# Patient Record
Sex: Female | Born: 1985 | Race: Black or African American | Hispanic: No | Marital: Single | Smoking: Current every day smoker
Health system: Southern US, Community
[De-identification: ages and names within clinical notes are randomized; demographics above are authoritative.]

## PROBLEM LIST (undated history)

## (undated) DIAGNOSIS — Z789 Other specified health status: Secondary | ICD-10-CM

## (undated) HISTORY — PX: NO PAST SURGERIES: SHX2092

---

## 2003-12-27 ENCOUNTER — Emergency Department: Payer: Self-pay | Admitting: Unknown Physician Specialty

## 2004-03-06 ENCOUNTER — Emergency Department: Payer: Self-pay | Admitting: Emergency Medicine

## 2004-04-03 ENCOUNTER — Emergency Department: Payer: Self-pay | Admitting: General Practice

## 2004-12-29 ENCOUNTER — Emergency Department: Payer: Self-pay | Admitting: Emergency Medicine

## 2005-07-01 ENCOUNTER — Emergency Department: Payer: Self-pay | Admitting: Emergency Medicine

## 2005-08-17 ENCOUNTER — Emergency Department: Payer: Self-pay | Admitting: Emergency Medicine

## 2006-06-13 ENCOUNTER — Emergency Department: Payer: Self-pay | Admitting: Emergency Medicine

## 2006-09-21 ENCOUNTER — Emergency Department: Payer: Self-pay

## 2009-08-02 ENCOUNTER — Emergency Department: Payer: Self-pay | Admitting: Emergency Medicine

## 2012-03-05 ENCOUNTER — Emergency Department: Payer: Self-pay | Admitting: Emergency Medicine

## 2013-05-19 ENCOUNTER — Emergency Department: Payer: Self-pay | Admitting: Internal Medicine

## 2013-08-13 ENCOUNTER — Emergency Department: Payer: Self-pay | Admitting: Emergency Medicine

## 2014-03-05 LAB — HM HIV SCREENING LAB: HM HIV Screening: NEGATIVE

## 2014-03-24 LAB — HM PAP SMEAR: HM Pap smear: NEGATIVE

## 2015-01-27 ENCOUNTER — Emergency Department: Payer: Self-pay

## 2015-01-27 ENCOUNTER — Emergency Department
Admission: EM | Admit: 2015-01-27 | Discharge: 2015-01-27 | Disposition: A | Discharge status: Home / Self Care | Payer: Self-pay | Attending: Emergency Medicine | Admitting: Emergency Medicine

## 2015-01-27 ENCOUNTER — Encounter: Payer: Self-pay | Admitting: *Deleted

## 2015-01-27 DIAGNOSIS — N939 Abnormal uterine and vaginal bleeding, unspecified: Secondary | ICD-10-CM

## 2015-01-27 DIAGNOSIS — F17200 Nicotine dependence, unspecified, uncomplicated: Secondary | ICD-10-CM | POA: Insufficient documentation

## 2015-01-27 DIAGNOSIS — D259 Leiomyoma of uterus, unspecified: Secondary | ICD-10-CM | POA: Insufficient documentation

## 2015-01-27 DIAGNOSIS — N7011 Chronic salpingitis: Secondary | ICD-10-CM | POA: Insufficient documentation

## 2015-01-27 DIAGNOSIS — R52 Pain, unspecified: Secondary | ICD-10-CM

## 2015-01-27 DIAGNOSIS — Z3202 Encounter for pregnancy test, result negative: Secondary | ICD-10-CM | POA: Insufficient documentation

## 2015-01-27 DIAGNOSIS — N938 Other specified abnormal uterine and vaginal bleeding: Secondary | ICD-10-CM | POA: Insufficient documentation

## 2015-01-27 LAB — CHLAMYDIA/NGC RT PCR (ARMC ONLY)
Chlamydia Tr: NOT DETECTED
N gonorrhoeae: NOT DETECTED

## 2015-01-27 LAB — BASIC METABOLIC PANEL
Anion gap: 6 (ref 5–15)
BUN: 10 mg/dL (ref 6–20)
CHLORIDE: 108 mmol/L (ref 101–111)
CO2: 26 mmol/L (ref 22–32)
CREATININE: 0.77 mg/dL (ref 0.44–1.00)
Calcium: 9 mg/dL (ref 8.9–10.3)
GFR calc Af Amer: >60 mL/min (ref >60)
Glucose, Bld: 111 mg/dL — ABNORMAL HIGH (ref 65–99)
POTASSIUM: 3.6 mmol/L (ref 3.5–5.1)
SODIUM: 140 mmol/L (ref 135–145)

## 2015-01-27 LAB — URINALYSIS COMPLETE WITH MICROSCOPIC (ARMC ONLY)
BILIRUBIN URINE: NEGATIVE
Bacteria, UA: NONE SEEN
Glucose, UA: NEGATIVE mg/dL
KETONES UR: NEGATIVE mg/dL
Leukocytes, UA: NEGATIVE
Nitrite: NEGATIVE
Protein, ur: 30 mg/dL — AB
SPECIFIC GRAVITY, URINE: 1.026 (ref 1.005–1.030)
pH: 5 (ref 5.0–8.0)

## 2015-01-27 LAB — CBC WITH DIFFERENTIAL/PLATELET
BASOS ABS: 0.1 10*3/uL (ref 0–0.1)
Basophils Relative: 1 %
EOS PCT: 3 %
Eosinophils Absolute: 0.4 10*3/uL (ref 0–0.7)
HCT: 37.4 % (ref 35.0–47.0)
HEMOGLOBIN: 12.6 g/dL (ref 12.0–16.0)
Lymphocytes Relative: 43 %
Lymphs Abs: 5.5 10*3/uL — ABNORMAL HIGH (ref 1.0–3.6)
MCH: 30.7 pg (ref 26.0–34.0)
MCHC: 33.5 g/dL (ref 32.0–36.0)
MCV: 91.6 fL (ref 80.0–100.0)
Monocytes Absolute: 0.9 10*3/uL (ref 0.2–0.9)
Monocytes Relative: 7 %
Neutro Abs: 5.9 10*3/uL (ref 1.4–6.5)
Neutrophils Relative %: 46 %
Platelets: 326 10*3/uL (ref 150–440)
RBC: 4.09 MIL/uL (ref 3.80–5.20)
RDW: 13.7 % (ref 11.5–14.5)
WBC: 12.8 10*3/uL — ABNORMAL HIGH (ref 3.6–11.0)

## 2015-01-27 LAB — WET PREP, GENITAL
Clue Cells Wet Prep HPF POC: POSITIVE — AB
SPERM: NONE SEEN
TRICH WET PREP: NONE SEEN
WBC WET PREP: NONE SEEN
YEAST WET PREP: NONE SEEN

## 2015-01-27 LAB — PREGNANCY, URINE: PREG TEST UR: NEGATIVE

## 2015-01-27 MED ORDER — SODIUM CHLORIDE 0.9 % IV BOLUS (SEPSIS)
500.0000 mL | Freq: Once | INTRAVENOUS | Status: AC
  Administered 2015-01-27: 500 mL via INTRAVENOUS

## 2015-01-27 MED ORDER — MEDROXYPROGESTERONE ACETATE 10 MG PO TABS: 10.0000 mg | ORAL_TABLET | Freq: Every day | ORAL | Status: DC

## 2015-01-27 MED ORDER — ONDANSETRON HCL 4 MG/2ML IJ SOLN
4.0000 mg | Freq: Once | INTRAMUSCULAR | Status: AC
  Administered 2015-01-27: 4 mg via INTRAVENOUS
  Filled 2015-01-27: qty 2

## 2015-01-27 MED ORDER — GADOBENATE DIMEGLUMINE 529 MG/ML IV SOLN
20.0000 mL | Freq: Once | INTRAVENOUS | Status: AC | PRN
  Administered 2015-01-27: 20 mL via INTRAVENOUS

## 2015-01-27 MED ORDER — MORPHINE SULFATE (PF) 2 MG/ML IV SOLN
2.0000 mg | Freq: Once | INTRAVENOUS | Status: AC
  Administered 2015-01-27: 2 mg via INTRAVENOUS
  Filled 2015-01-27: qty 1

## 2015-01-27 NOTE — ED Provider Notes (Signed)
[facility] Emergency Department Provider Note  ____________________________________________  Time seen: Approximately 5:58 AM  I have reviewed the triage vital signs and the nursing notes.   HISTORY  Chief Complaint Vaginal Bleeding    HPI Kelsey Hopkins is a 30 y.o. female who presents to the ED with a chief complaint of vaginal bleeding. Patient reports vaginal bleeding for 26 days. She was seen at [facility] ED last week and prescribed medicine but did not get it filled. Presents with pelvic pain and continued vaginal bleeding with some clots. Denies associated fever, chills, nausea, vomiting, diarrhea, dysuria. Denies chest pain, shortness breath. Reports prior episodes with heavy vaginal bleeding but denies prior history of fibroids or ovarian cysts. Denies recent trauma or travel. Nothing makes her symptoms better or worse.   Past medical history None  There are no active problems to display for this patient.   No past surgical history on file.  No current outpatient prescriptions on file.  Allergies Review of patient's allergies indicates no known allergies.  No family history on file.  Social History Social History  Substance Use Topics  . Smoking status: Current Every Day Smoker  . Smokeless tobacco: None  . Alcohol Use: No    Review of Systems Constitutional: No fever/chills Eyes: No visual changes. ENT: No sore throat. Cardiovascular: Denies chest pain. Respiratory: Denies shortness of breath. Gastrointestinal: Positive for pelvic pain.  No nausea, no vomiting.  No diarrhea.  No constipation. Genitourinary: Positive for persistent vaginal bleeding. Denies vaginal discharge. Negative for dysuria. Musculoskeletal: Negative for back pain. Skin: Negative for rash. Neurological: Negative for headaches, focal weakness or numbness.  10-point ROS otherwise negative.  ____________________________________________   PHYSICAL EXAM:  VITAL  SIGNS: ED Triage Vitals  Enc Vitals Group     BP 01/27/15 0039 141/80 mmHg     Pulse Rate 01/27/15 0039 71     Resp 01/27/15 0039 20     Temp 01/27/15 0039 98.5 F (36.9 C)     Temp Source 01/27/15 0039 Oral     SpO2 01/27/15 0039 99 %     Weight 01/27/15 0039 280 lb (127.007 kg)     Height 01/27/15 0039 5\' 11"  (1.803 m)     Head Cir --      Peak Flow --      Pain Score 01/27/15 0041 8     Pain Loc --      Pain Edu? --      Excl. in [place]? --     Constitutional: Alert and oriented. Well appearing and in no acute distress. Eyes: Conjunctivae are normal. PERRL. EOMI. Head: Atraumatic. Nose: No congestion/rhinnorhea. Mouth/Throat: Mucous membranes are moist.  Oropharynx non-erythematous. Neck: No stridor.   Cardiovascular: Normal rate, regular rhythm. Grossly normal heart sounds.  Good peripheral circulation. Respiratory: Normal respiratory effort.  No retractions. Lungs CTAB. Gastrointestinal: Soft and mildly tender to palpation lower abdomen without rebound or guarding. No distention. No abdominal bruits. No CVA tenderness. Musculoskeletal: No lower extremity tenderness nor edema.  No joint effusions. Neurologic:  Normal speech and language. No gross focal neurologic deficits are appreciated. No gait instability. Skin:  Skin is warm, dry and intact. No rash noted. Psychiatric: Mood and affect are normal. Speech and behavior are normal.  ____________________________________________   LABS (all labs ordered are listed, but only abnormal results are displayed)  Labs Reviewed  CBC WITH DIFFERENTIAL/PLATELET - Abnormal; Notable for the following:    WBC 12.8 (*)    Lymphs Abs  5.5 (*)    All other components within normal limits  BASIC METABOLIC PANEL - Abnormal; Notable for the following:    Glucose, Bld 111 (*)    All other components within normal limits  URINALYSIS COMPLETEWITH MICROSCOPIC ([facility] ONLY) - Abnormal; Notable for the following:    Color, Urine YELLOW (*)     APPearance HAZY (*)    Hgb urine dipstick 3+ (*)    Protein, ur 30 (*)    Squamous Epithelial / LPF 0-5 (*)    All other components within normal limits  PREGNANCY, URINE   ____________________________________________  EKG  None ____________________________________________  RADIOLOGY  US Pelvis: 1. Large 8.0 cm multiloculated cystic structure with multiple septations at the right adnexa. Normal ovarian tissue not definitely seen. Minimal associated blood flow noted. This is of uncertain etiology. Would correlate with patient's symptoms. MRI would be helpful for further evaluation, as deemed clinically appropriate. 2. Trace fluid within the endometrial canal and at the cervix likely corresponds to the patient's vaginal bleeding. 3. Small myometrial fibroid noted. ____________________________________________   PROCEDURES  Procedure(s) performed: None  Critical Care performed: No  ____________________________________________   INITIAL IMPRESSION / ASSESSMENT AND PLAN / ED COURSE  Pertinent labs & imaging results that were available during my care of the patient were reviewed by me and considered in my medical decision making (see chart for details).  30 year old female who presents with almost 1 month history of vaginal bleeding. Laboratory results reassuring. Noted urinalysis with TNTC WBC; however patient is asymptomatic with negative nitrite and leukocyte. Will hold off antibiotic treatment and obtain urine culture at this time. Will send for pelvic ultrasound to evaluate etiology for persistent vaginal bleeding.  ----------------------------------------- 6:43 AM on 01/27/2015 -----------------------------------------  Updated patient and spouse of ultrasound results and radiologist's recommendation to proceed with MRI.  ----------------------------------------- 7:57 AM on 01/27/2015 -----------------------------------------  Care transferred to Dr. Reita Cliche pending  MRI results. ____________________________________________   FINAL CLINICAL IMPRESSION(S) / ED DIAGNOSES  Final diagnoses:  Pain  DUB (dysfunctional uterine bleeding)  Uterine leiomyoma, unspecified location      Paulette Blanch, MD 01/27/15 [phone]

## 2015-01-27 NOTE — ED Notes (Signed)
Pt states she has had this bleeding for about a month now and has been using pads and tampons.  She bought a pack of 36 pads and is using about 9 a week.  She reports having cramping pains while on the toilet bleeding out clots.  She also reports going to [facility] and said they did no imaging to find out what the problem was.  She did not fill her rx from that visit.

## 2015-01-27 NOTE — ED Notes (Signed)
Unsuccessful IV start x2, referring to another nurse.

## 2015-01-27 NOTE — ED Notes (Signed)
To mri via w/c.

## 2015-01-27 NOTE — Discharge Instructions (Signed)
Abnormal Uterine Bleeding Abnormal uterine bleeding can affect women at various stages in life, including teenagers, women in their reproductive years, pregnant women, and women who have reached menopause. Several kinds of uterine bleeding are considered abnormal, including:  Bleeding or spotting between periods.   Bleeding after sexual intercourse.   Bleeding that is heavier or more than normal.   Periods that last longer than usual.  Bleeding after menopause.  Many cases of abnormal uterine bleeding are minor and simple to treat, while others are more serious. Any type of abnormal bleeding should be evaluated by your health care provider. Treatment will depend on the cause of the bleeding. HOME CARE INSTRUCTIONS Monitor your condition for any changes. The following actions may help to alleviate any discomfort you are experiencing:  Avoid the use of tampons and douches as directed by your health care provider.  Change your pads frequently. You should get regular pelvic exams and Pap tests. Keep all follow-up appointments for diagnostic tests as directed by your health care provider.  SEEK MEDICAL CARE IF:   Your bleeding lasts more than 1 week.   You feel dizzy at times.  SEEK IMMEDIATE MEDICAL CARE IF:   You pass out.   You are changing pads every 15 to 30 minutes.   You have abdominal pain.  You have a fever.   You become sweaty or weak.   You are passing large blood clots from the vagina.   You start to feel nauseous and vomit. MAKE SURE YOU:   Understand these instructions.  Will watch your condition.  Will get help right away if you are not doing well or get worse.   This information is not intended to replace advice given to you by your health care provider. Make sure you discuss any questions you have with your health care provider.   Document Released: 12/20/2004 Document Revised: 12/25/2012 Document Reviewed: 07/19/2012 Elsevier Interactive  Patient Education 2016 Elsevier Inc.  Dysfunctional Uterine Bleeding Dysfunctional uterine bleeding is abnormal bleeding from the uterus. Dysfunctional uterine bleeding includes:  A period that comes earlier or later than usual.  A period that is lighter, heavier, or has blood clots.  Bleeding between periods.  Skipping one or more periods.  Bleeding after sexual intercourse.  Bleeding after menopause. HOME CARE INSTRUCTIONS  Pay attention to any changes in your symptoms. Follow these instructions to help with your condition: Eating  Eat well-balanced meals. Include foods that are high in iron, such as liver, meat, shellfish, green leafy vegetables, and eggs.  If you become constipated:  Drink plenty of water.  Eat fruits and vegetables that are high in water and fiber, such as spinach, carrots, raspberries, apples, and mango. Medicines  Take over-the-counter and prescription medicines only as told by your health care provider.  Do not change medicines without talking with your health care provider.  Aspirin or medicines that contain aspirin may make the bleeding worse. Do not take those medicines:  During the week before your period.  During your period.  If you were prescribed iron pills, take them as told by your health care provider. Iron pills help to replace iron that your body loses because of this condition. Activity  If you need to change your sanitary pad or tampon more than one time every 2 hours:  Lie in bed with your feet raised (elevated).  Place a cold pack on your lower abdomen.  Rest as much as possible until the bleeding stops or slows down.  Do not try to lose weight until the bleeding has stopped and your blood iron level is back to normal. Other Instructions  For two months, write down:  When your period starts.  When your period ends.  When any abnormal bleeding occurs.  What problems you notice.  Keep all follow up visits as told  by your health care provider. This is important. SEEK MEDICAL CARE IF:  You get light-headed or weak.  You have nausea and vomiting.  You cannot eat or drink without vomiting.  You feel dizzy or have diarrhea while you are taking medicines.  You are taking birth control pills or hormones, and you want to change them or stop taking them. SEEK IMMEDIATE MEDICAL CARE IF:  You develop a fever or chills.  You need to change your sanitary pad or tampon more than one time per hour.  Your bleeding becomes heavier, or your flow contains clots more often.  You develop pain in your abdomen.  You lose consciousness.  You develop a rash.   This information is not intended to replace advice given to you by your health care provider. Make sure you discuss any questions you have with your health care provider.   Document Released: 12/18/1999 Document Revised: 09/10/2014 Document Reviewed: 03/17/2014 Elsevier Interactive Patient Education 2016 Elsevier Inc.  Uterine Fibroids Uterine fibroids are tissue masses (tumors) that can develop in the womb (uterus). They are also called leiomyomas. This type of tumor is not cancerous (benign) and does not spread to other parts of the body outside of the pelvic area, which is between the hip bones. Occasionally, fibroids may develop in the fallopian tubes, in the cervix, or on the support structures (ligaments) that surround the uterus. You can have one or many fibroids. Fibroids can vary in size, weight, and where they grow in the uterus. Some can become quite large. Most fibroids do not require medical treatment. CAUSES A fibroid can develop when a single uterine cell keeps growing (replicating). Most cells in the human body have a control mechanism that keeps them from replicating without control. SIGNS AND SYMPTOMS Symptoms may include:   Heavy bleeding during your period.  Bleeding or spotting between periods.  Pelvic pain and  pressure.  Bladder problems, such as needing to urinate more often (urinary frequency) or urgently.  Inability to reproduce offspring (infertility).  Miscarriages. DIAGNOSIS Uterine fibroids are diagnosed through a physical exam. Your health care provider may feel the lumpy tumors during a pelvic exam. Ultrasonography and an MRI may be done to determine the size, location, and number of fibroids. TREATMENT Treatment may include:  Watchful waiting. This involves getting the fibroid checked by your health care provider to see if it grows or shrinks. Follow your health care provider's recommendations for how often to have this checked.  Hormone medicines. These can be taken by mouth or given through an intrauterine device (IUD).  Surgery.  Removing the fibroids (myomectomy) or the uterus (hysterectomy).  Removing blood supply to the fibroids (uterine artery embolization). If fibroids interfere with your fertility and you want to become pregnant, your health care provider may recommend having the fibroids removed.  HOME CARE INSTRUCTIONS  Keep all follow-up visits as directed by your health care provider. This is important.  Take medicines only as directed by your health care provider.  If you were prescribed a hormone treatment, take the hormone medicines exactly as directed.  Do not take aspirin, because it can cause bleeding.  Ask your health  care provider about taking iron pills and increasing the amount of dark green, leafy vegetables in your diet. These actions can help to boost your blood iron levels, which may be affected by heavy menstrual bleeding.  Pay close attention to your period and tell your health care provider about any changes, such as:  Increased blood flow that requires you to use more pads or tampons than usual per month.  A change in the number of days that your period lasts per month.  A change in symptoms that are associated with your period, such as  abdominal cramping or back pain. SEEK MEDICAL CARE IF:  You have pelvic pain, back pain, or abdominal cramps that cannot be controlled with medicines.  You have an increase in bleeding between and during periods.  You soak tampons or pads in a half hour or less.  You feel lightheaded, extra tired, or weak. SEEK IMMEDIATE MEDICAL CARE IF:  You faint.  You have a sudden increase in pelvic pain.   This information is not intended to replace advice given to you by your health care provider. Make sure you discuss any questions you have with your health care provider.   Document Released: 12/18/1999 Document Revised: 01/10/2014 Document Reviewed: 06/18/2013 Elsevier Interactive Patient Education [employer].

## 2015-01-27 NOTE — ED Notes (Signed)
Pt reports pt has vaginal bleeding for 26 days.  Pt seen [facility] last [facility] with similar sx.  Pt did not get prescriptions filled.  No dysuria .

## 2015-01-27 NOTE — ED Notes (Signed)
Patient transported to Ultrasound 

## 2015-01-27 NOTE — ED Provider Notes (Signed)
[facility]  I accepted care from Dr. Beather Arbour ____________________________________________     [place] were viewed by me. Imaging interpreted by radiologist.  MR pelvis:  IMPRESSION: 1. Marked right-sided hydrosalpinx accounting for the patient's cystic structure identified on recent pelvic sonogram 2. Small fundal fibroid.  ____________________________________________   PROCEDURES  Procedure(s) performed: None  Critical Care performed: None  ____________________________________________   INITIAL IMPRESSION / ASSESSMENT AND PLAN / ED COURSE   Pertinent labs & imaging results that were available during my care of the patient were reviewed by me and considered in my medical decision making (see chart for details).  Pending MRI results, I discussed the result with the patient. There is marked right-sided hydrosalpinx. I discussed this case with Dr. Star Age, GYN who recommended pelvic exam with cultures.  Patient does not have symptoms concerning for tubo-ovarian abscess or cervicitis on exam. Her white blood count is minimally elevated. She does not have significant amount pain or elevated temperature.  I did a pelvic exam and there is small amount of bleeding, no discharge and no cervicitis on bimanual exam. Wet prep positive for clue cells and otherwise negative. Gonorrhea and Chlamydia were sent. Given no high concern for infection, no presumptive treatment was initiated.  Recommend follow-up with OB/GYN. She was discharged with Provera for irregular bleeding.  CONSULTATIONS: Dr. Georgianne Fick, phone, ob gyn    Patient / Family / Caregiver informed of clinical course, medical decision-making process, and agree with plan.   I discussed return precautions, follow-up instructions, and discharged instructions with patient and/or family.    ____________________________________________   FINAL CLINICAL IMPRESSION(S) / ED DIAGNOSES  Final  diagnoses:  Pain  DUB (dysfunctional uterine bleeding)  Uterine leiomyoma, unspecified location  Hydrosalpinx        Lisa Roca, MD 01/27/15 1457

## 2015-01-29 LAB — URINE CULTURE: Special Requests: NORMAL

## 2015-02-23 ENCOUNTER — Encounter
Admission: RE | Admit: 2015-02-23 | Discharge: 2015-02-23 | Disposition: A | Discharge status: Home / Self Care | Payer: Self-pay | Source: Ambulatory Visit | Attending: Obstetrics and Gynecology | Admitting: Obstetrics and Gynecology

## 2015-02-23 DIAGNOSIS — Z01812 Encounter for preprocedural laboratory examination: Secondary | ICD-10-CM | POA: Insufficient documentation

## 2015-02-23 HISTORY — DX: Other specified health status: Z78.9

## 2015-02-23 LAB — CBC
HEMATOCRIT: 38.5 % (ref 35.0–47.0)
HEMOGLOBIN: 12.9 g/dL (ref 12.0–16.0)
MCH: 30.5 pg (ref 26.0–34.0)
MCHC: 33.5 g/dL (ref 32.0–36.0)
MCV: 91.1 fL (ref 80.0–100.0)
Platelets: 341 10*3/uL (ref 150–440)
RBC: 4.23 MIL/uL (ref 3.80–5.20)
RDW: 13.1 % (ref 11.5–14.5)
WBC: 12.4 10*3/uL — ABNORMAL HIGH (ref 3.6–11.0)

## 2015-02-23 NOTE — Patient Instructions (Signed)
  Your procedure is scheduled on: Thursday 02/26/15 Report to Day Surgery. 2ND FLOOR MEDICAL MALL ENTRANCE To find out your arrival time please call [phone] between 1PM - 3PM on Wednesday 02/25/15.  Remember: Instructions that are not followed completely may result in serious medical risk, up to and including death, or upon the discretion of your surgeon and anesthesiologist your surgery may need to be rescheduled.    __X__ 1. Do not eat food or drink liquids after midnight. No gum chewing or hard candies.     __X__ 2. No Alcohol for 24 hours before or after surgery.   ____ 3. Bring all medications with you on the day of surgery if instructed.    __X__ 4. Notify your doctor if there is any change in your medical condition     (cold, fever, infections).     Do not wear jewelry, make-up, hairpins, clips or nail polish.  Do not wear lotions, powders, or perfumes. You may wear deodorant.  Do not shave 48 hours prior to surgery. Men may shave face and neck.  Do not bring valuables to the hospital.    [facility] is not responsible for any belongings or valuables.               Contacts, dentures or bridgework may not be worn into surgery.  Leave your suitcase in the car. After surgery it may be brought to your room.  For patients admitted to the hospital, discharge time is determined by your                treatment team.   Patients discharged the day of surgery will not be allowed to drive home.   Please read over the following fact sheets that you were given:   Surgical Site Infection Prevention   ____ Take these medicines the morning of surgery with A SIP OF WATER:    1. NONE  2.   3.   4.  5.  6.  ____ Fleet Enema (as directed)   __X__ Use CHG Soap as directed  ____ Use inhalers on the day of surgery  ____ Stop metformin 2 days prior to surgery    ____ Take 1/2 of usual insulin dose the night before surgery and none on the morning of surgery.   ____ Stop  Coumadin/Plavix/aspirin on   ____ Stop Anti-inflammatories on    ____ Stop supplements until after surgery.    ____ Bring C-Pap to the hospital.

## 2015-02-26 ENCOUNTER — Ambulatory Visit: Payer: MEDICAID | Admitting: Anesthesiology

## 2015-02-26 ENCOUNTER — Ambulatory Visit
Admission: RE | Admit: 2015-02-26 | Discharge: 2015-02-26 | Disposition: A | Discharge status: Home / Self Care | Payer: Self-pay | Source: Ambulatory Visit | Attending: Obstetrics and Gynecology | Admitting: Obstetrics and Gynecology

## 2015-02-26 ENCOUNTER — Encounter: Payer: Self-pay | Admitting: *Deleted

## 2015-02-26 ENCOUNTER — Encounter
Admission: RE | Disposition: A | Discharge status: Home / Self Care | Payer: Self-pay | Source: Ambulatory Visit | Attending: Obstetrics and Gynecology

## 2015-02-26 DIAGNOSIS — N7011 Chronic salpingitis: Secondary | ICD-10-CM | POA: Insufficient documentation

## 2015-02-26 DIAGNOSIS — Z9889 Other specified postprocedural states: Secondary | ICD-10-CM

## 2015-02-26 DIAGNOSIS — Z800 Family history of malignant neoplasm of digestive organs: Secondary | ICD-10-CM | POA: Insufficient documentation

## 2015-02-26 DIAGNOSIS — Z808 Family history of malignant neoplasm of other organs or systems: Secondary | ICD-10-CM | POA: Insufficient documentation

## 2015-02-26 DIAGNOSIS — Z833 Family history of diabetes mellitus: Secondary | ICD-10-CM | POA: Insufficient documentation

## 2015-02-26 DIAGNOSIS — N971 Female infertility of tubal origin: Secondary | ICD-10-CM | POA: Insufficient documentation

## 2015-02-26 DIAGNOSIS — Z8049 Family history of malignant neoplasm of other genital organs: Secondary | ICD-10-CM | POA: Insufficient documentation

## 2015-02-26 DIAGNOSIS — Z8249 Family history of ischemic heart disease and other diseases of the circulatory system: Secondary | ICD-10-CM | POA: Insufficient documentation

## 2015-02-26 DIAGNOSIS — Z79899 Other long term (current) drug therapy: Secondary | ICD-10-CM | POA: Insufficient documentation

## 2015-02-26 DIAGNOSIS — Z8379 Family history of other diseases of the digestive system: Secondary | ICD-10-CM | POA: Insufficient documentation

## 2015-02-26 DIAGNOSIS — Z820 Family history of epilepsy and other diseases of the nervous system: Secondary | ICD-10-CM | POA: Insufficient documentation

## 2015-02-26 DIAGNOSIS — N736 Female pelvic peritoneal adhesions (postinfective): Secondary | ICD-10-CM | POA: Insufficient documentation

## 2015-02-26 HISTORY — PX: CHROMOPERTUBATION: SHX6288

## 2015-02-26 HISTORY — PX: LAPAROSCOPIC UNILATERAL SALPINGECTOMY: SHX5934

## 2015-02-26 LAB — URINE DRUG SCREEN, QUALITATIVE (ARMC ONLY)
AMPHETAMINES, UR SCREEN: NOT DETECTED
Barbiturates, Ur Screen: NOT DETECTED
Benzodiazepine, Ur Scrn: NOT DETECTED
Cannabinoid 50 Ng, Ur ~~LOC~~: POSITIVE — AB
Cocaine Metabolite,Ur ~~LOC~~: NOT DETECTED
MDMA (ECSTASY) UR SCREEN: NOT DETECTED
METHADONE SCREEN, URINE: NOT DETECTED
OPIATE, UR SCREEN: NOT DETECTED
PHENCYCLIDINE (PCP) UR S: NOT DETECTED
Tricyclic, Ur Screen: NOT DETECTED

## 2015-02-26 LAB — POCT PREGNANCY, URINE: PREG TEST UR: NEGATIVE

## 2015-02-26 SURGERY — SALPINGECTOMY, UNILATERAL, LAPAROSCOPIC
Anesthesia: General | Laterality: Right | Wound class: Clean

## 2015-02-26 MED ORDER — ONDANSETRON HCL 4 MG/2ML IJ SOLN
INTRAMUSCULAR | Status: DC | PRN
  Administered 2015-02-26: 4 mg via INTRAVENOUS

## 2015-02-26 MED ORDER — FENTANYL CITRATE (PF) 100 MCG/2ML IJ SOLN
25.0000 ug | INTRAMUSCULAR | Status: DC | PRN
  Administered 2015-02-26 (×4): 25 ug via INTRAVENOUS

## 2015-02-26 MED ORDER — LACTATED RINGERS IV SOLN
INTRAVENOUS | Status: DC
  Administered 2015-02-26 (×2): via INTRAVENOUS

## 2015-02-26 MED ORDER — PROPOFOL 10 MG/ML IV BOLUS
INTRAVENOUS | Status: DC | PRN
  Administered 2015-02-26: 160 mg via INTRAVENOUS

## 2015-02-26 MED ORDER — ROCURONIUM BROMIDE 100 MG/10ML IV SOLN
INTRAVENOUS | Status: DC | PRN
  Administered 2015-02-26 (×2): 10 mg via INTRAVENOUS
  Administered 2015-02-26: 40 mg via INTRAVENOUS

## 2015-02-26 MED ORDER — GLYCOPYRROLATE 0.2 MG/ML IJ SOLN
INTRAMUSCULAR | Status: DC | PRN
  Administered 2015-02-26: .8 mg via INTRAVENOUS

## 2015-02-26 MED ORDER — FENTANYL CITRATE (PF) 100 MCG/2ML IJ SOLN
INTRAMUSCULAR | Status: DC | PRN
  Administered 2015-02-26: 25 ug via INTRAVENOUS
  Administered 2015-02-26: 50 ug via INTRAVENOUS
  Administered 2015-02-26: 25 ug via INTRAVENOUS
  Administered 2015-02-26 (×2): 50 ug via INTRAVENOUS
  Administered 2015-02-26: 25 ug via INTRAVENOUS
  Administered 2015-02-26: 100 ug via INTRAVENOUS
  Administered 2015-02-26: 25 ug via INTRAVENOUS

## 2015-02-26 MED ORDER — ONDANSETRON HCL 4 MG/2ML IJ SOLN: 4.0000 mg | Freq: Once | INTRAMUSCULAR | Status: DC | PRN

## 2015-02-26 MED ORDER — DOXYCYCLINE HYCLATE 100 MG PO CAPS: 100.0000 mg | ORAL_CAPSULE | Freq: Two times a day (BID) | ORAL | Status: DC

## 2015-02-26 MED ORDER — IBUPROFEN 600 MG PO TABS: 600.0000 mg | ORAL_TABLET | Freq: Four times a day (QID) | ORAL | Status: DC | PRN

## 2015-02-26 MED ORDER — OXYCODONE-ACETAMINOPHEN 5-325 MG PO TABS: 1.0000 | ORAL_TABLET | Freq: Four times a day (QID) | ORAL | Status: DC | PRN

## 2015-02-26 MED ORDER — OXYCODONE-ACETAMINOPHEN 5-325 MG PO TABS
ORAL_TABLET | ORAL | Status: AC
  Administered 2015-02-26: 1 via ORAL
  Filled 2015-02-26: qty 1

## 2015-02-26 MED ORDER — OXYCODONE-ACETAMINOPHEN 5-325 MG PO TABS
1.0000 | ORAL_TABLET | Freq: Four times a day (QID) | ORAL | Status: DC | PRN
  Administered 2015-02-26: 1 via ORAL

## 2015-02-26 MED ORDER — METHYLENE BLUE 0.5 % INJ SOLN
INTRAVENOUS | Status: AC
  Filled 2015-02-26: qty 10

## 2015-02-26 MED ORDER — FAMOTIDINE 20 MG PO TABS
ORAL_TABLET | ORAL | Status: AC
  Administered 2015-02-26: 20 mg via ORAL
  Filled 2015-02-26: qty 1

## 2015-02-26 MED ORDER — LIDOCAINE HCL (CARDIAC) 20 MG/ML IV SOLN
INTRAVENOUS | Status: DC | PRN
  Administered 2015-02-26: 100 mg via INTRAVENOUS

## 2015-02-26 MED ORDER — BUPIVACAINE HCL (PF) 0.5 % IJ SOLN
INTRAMUSCULAR | Status: AC
  Filled 2015-02-26: qty 30

## 2015-02-26 MED ORDER — MIDAZOLAM HCL 2 MG/2ML IJ SOLN
INTRAMUSCULAR | Status: DC | PRN
  Administered 2015-02-26: 2 mg via INTRAVENOUS

## 2015-02-26 MED ORDER — BUPIVACAINE HCL 0.5 % IJ SOLN
INTRAMUSCULAR | Status: DC | PRN
Start: 2015-02-26 — End: 2015-02-26
  Administered 2015-02-26: 14 mL

## 2015-02-26 MED ORDER — FENTANYL CITRATE (PF) 100 MCG/2ML IJ SOLN
INTRAMUSCULAR | Status: AC
  Administered 2015-02-26: 25 ug via INTRAVENOUS
  Filled 2015-02-26: qty 2

## 2015-02-26 MED ORDER — NEOSTIGMINE METHYLSULFATE 10 MG/10ML IV SOLN
INTRAVENOUS | Status: DC | PRN
  Administered 2015-02-26: 5 mg via INTRAVENOUS

## 2015-02-26 MED ORDER — FAMOTIDINE 20 MG PO TABS
20.0000 mg | ORAL_TABLET | Freq: Once | ORAL | Status: AC
  Administered 2015-02-26: 20 mg via ORAL

## 2015-02-26 MED ORDER — HYDROMORPHONE HCL 1 MG/ML IJ SOLN
INTRAMUSCULAR | Status: AC
  Filled 2015-02-26: qty 1

## 2015-02-26 SURGICAL SUPPLY — 44 items
ANCHOR TIS RET SYS 235ML (MISCELLANEOUS) IMPLANT
BAG URO DRAIN 2000ML W/SPOUT (MISCELLANEOUS) ×3 IMPLANT
BLADE SURG SZ11 CARB STEEL (BLADE) ×3 IMPLANT
CANISTER SUCT 1200ML W/VALVE (MISCELLANEOUS) ×3 IMPLANT
CANISTER SUCT 3000ML (MISCELLANEOUS) ×3 IMPLANT
CATH FOLEY 2WAY  5CC 16FR (CATHETERS) ×1
CATH ROBINSON RED A/P 16FR (CATHETERS) ×3 IMPLANT
CATH URTH 16FR FL 2W BLN LF (CATHETERS) ×2 IMPLANT
CHLORAPREP W/TINT 26ML (MISCELLANEOUS) ×3 IMPLANT
DEVICE SUTURE ENDOST 10MM (ENDOMECHANICALS) IMPLANT
DEVICE TROCAR PUNCTURE CLOSURE (ENDOMECHANICALS) IMPLANT
DRESSING TELFA 4X3 1S ST N-ADH (GAUZE/BANDAGES/DRESSINGS) ×3 IMPLANT
DRSG TEGADERM 2-3/8X2-3/4 SM (GAUZE/BANDAGES/DRESSINGS) ×3 IMPLANT
GLOVE BIO SURGEON STRL SZ7 (GLOVE) ×3 IMPLANT
GLOVE INDICATOR 7.5 STRL GRN (GLOVE) ×3 IMPLANT
GOWN STRL REUS W/ TWL LRG LVL3 (GOWN DISPOSABLE) ×4 IMPLANT
GOWN STRL REUS W/ TWL XL LVL3 (GOWN DISPOSABLE) IMPLANT
GOWN STRL REUS W/TWL LRG LVL3 (GOWN DISPOSABLE) ×2
GOWN STRL REUS W/TWL XL LVL3 (GOWN DISPOSABLE)
GRASPER SUT TROCAR 14GX15 (MISCELLANEOUS) IMPLANT
IRRIGATION STRYKERFLOW (MISCELLANEOUS) ×2 IMPLANT
IRRIGATOR STRYKERFLOW (MISCELLANEOUS) ×3
IV LACTATED RINGERS 1000ML (IV SOLUTION) ×3 IMPLANT
KIT RM TURNOVER CYSTO AR (KITS) ×3 IMPLANT
LABEL OR SOLS (LABEL) ×3 IMPLANT
LIGASURE BLUNT 5MM 37CM (INSTRUMENTS) IMPLANT
LIQUID BAND (GAUZE/BANDAGES/DRESSINGS) ×3 IMPLANT
NS IRRIG 500ML POUR BTL (IV SOLUTION) ×3 IMPLANT
PACK DNC HYST (MISCELLANEOUS) ×3 IMPLANT
PACK GYN LAPAROSCOPIC (MISCELLANEOUS) ×3 IMPLANT
PAD OB MATERNITY 4.3X12.25 (PERSONAL CARE ITEMS) ×3 IMPLANT
PAD PREP 24X41 OB/GYN DISP (PERSONAL CARE ITEMS) ×3 IMPLANT
SCALPEL HARMONIC ACE (MISCELLANEOUS) ×3 IMPLANT
SCISSORS METZENBAUM CVD 33 (INSTRUMENTS) IMPLANT
SHEARS HARMONIC ACE PLUS 36CM (ENDOMECHANICALS) ×3 IMPLANT
SLEEVE ENDOPATH XCEL 5M (ENDOMECHANICALS) ×3 IMPLANT
SUT ENDO VLOC 180-0-8IN (SUTURE) IMPLANT
SUT MNCRL AB 4-0 PS2 18 (SUTURE) ×3 IMPLANT
SUT VIC AB 2-0 UR6 27 (SUTURE) ×3 IMPLANT
TOWEL OR 17X26 4PK STRL BLUE (TOWEL DISPOSABLE) ×3 IMPLANT
TROCAR ENDO BLADELESS 11MM (ENDOMECHANICALS) ×3 IMPLANT
TROCAR XCEL NON-BLD 5MMX100MML (ENDOMECHANICALS) ×3 IMPLANT
TUBING ART PRESS 48 MALE/FEM (TUBING) ×3 IMPLANT
TUBING INSUFFLATOR HI FLOW (MISCELLANEOUS) ×3 IMPLANT

## 2015-02-26 NOTE — H&P (Signed)
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

## 2015-02-26 NOTE — Anesthesia Procedure Notes (Signed)
Procedure Name: Intubation Performed by: Lance Muss Pre-anesthesia Checklist: Patient identified, Emergency Drugs available, Suction available, Patient being monitored and Timeout performed Patient Re-evaluated:Patient Re-evaluated prior to inductionOxygen Delivery Method: Circle system utilized Preoxygenation: Pre-oxygenation with 100% oxygen Intubation Type: IV induction Ventilation: Mask ventilation without difficulty Laryngoscope Size: Mac and 3 Grade View: Grade I Tube type: Oral Tube size: 7.0 mm Number of attempts: 1 Airway Equipment and Method: Stylet Placement Confirmation: ETT inserted through vocal cords under direct vision,  positive ETCO2 and breath sounds checked- equal and bilateral Secured at: 22 cm Tube secured with: Tape Dental Injury: Teeth and Oropharynx as per pre-operative assessment

## 2015-02-26 NOTE — H&P (Deleted)
Initial H&P reviewed   History reviewed, patient examined, no change in status, stable for surgery.  

## 2015-02-26 NOTE — Anesthesia Postprocedure Evaluation (Signed)
Anesthesia Post Note  Patient: Kelsey Hopkins  Procedure(s) Performed: Procedure(s) (LRB): LAPAROSCOPIC UNILATERAL SALPINGECTOMY (Right) CHROMOPERTUBATION (N/A)  Patient location during evaluation: PACU Anesthesia Type: General Level of consciousness: awake and alert Pain management: pain level controlled Vital Signs Assessment: post-procedure vital signs reviewed and stable Respiratory status: spontaneous breathing and respiratory function stable Cardiovascular status: stable Anesthetic complications: no    Last Vitals:  Filed Vitals:   02/26/15 1115 02/26/15 1128  BP:  122/90  Pulse:  67  Temp: 36.8 C 36.6 C  Resp:  14    Last Pain:  Filed Vitals:   02/26/15 1131  PainSc: 5                  Dannell Gortney K

## 2015-02-26 NOTE — Anesthesia Preprocedure Evaluation (Signed)
Anesthesia Evaluation  Patient identified by MRN, date of birth, ID band Patient awake    Reviewed: Allergy & Precautions, NPO status , Patient's Chart, lab work & pertinent test results  History of Anesthesia Complications Negative for: history of anesthetic complications  Airway Mallampati: III       Dental   Pulmonary neg pulmonary ROS, Current Smoker,           Cardiovascular negative cardio ROS       Neuro/Psych negative neurological ROS     GI/Hepatic negative GI ROS, Neg liver ROS,   Endo/Other  negative endocrine ROS  Renal/GU negative Renal ROS     Musculoskeletal   Abdominal   Peds  Hematology negative hematology ROS (+)   Anesthesia Other Findings   Reproductive/Obstetrics                             Anesthesia Physical Anesthesia Plan  ASA: II  Anesthesia Plan: General   Post-op Pain Management:    Induction: Intravenous  Airway Management Planned: Oral ETT  Additional Equipment:   Intra-op Plan:   Post-operative Plan:   Informed Consent: I have reviewed the patients History and Physical, chart, labs and discussed the procedure including the risks, benefits and alternatives for the proposed anesthesia with the patient or authorized representative who has indicated his/her understanding and acceptance.     Plan Discussed with:   Anesthesia Plan Comments:         Anesthesia Quick Evaluation

## 2015-02-26 NOTE — Discharge Instructions (Signed)

## 2015-02-26 NOTE — OR Nursing (Signed)
Dr. Georgianne Fick at bedside talking with pt. And family about surgery and discharge

## 2015-02-26 NOTE — Transfer of Care (Signed)
Immediate Anesthesia Transfer of Care Note  Patient: Kelsey Hopkins  Procedure(s) Performed: Procedure(s): LAPAROSCOPIC UNILATERAL SALPINGECTOMY (Right) CHROMOPERTUBATION (N/A)  Patient Location: PACU  Anesthesia Type:General  Level of Consciousness: awake, alert  and oriented  Airway & Oxygen Therapy: Patient Spontanous Breathing and Patient connected to face mask oxygen  Post-op Assessment: Report given to RN and Post -op Vital signs reviewed and stable  Post vital signs: Reviewed and stable  Last Vitals:  Filed Vitals:   02/26/15 1024 02/26/15 1025  BP: 119/98 119/98  Pulse: 103 95  Temp: 36.7 C   Resp: 12 14    Complications: No apparent anesthesia complications

## 2015-02-26 NOTE — Op Note (Signed)
Preoperative Diagnosis: 1) 30 y.o.  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 G0 with hydrosalpinx on MRI, desires future fertility  Anesthesia: General  Preoperative Antibiotics: none  Estimated Blood Loss: minimal  IV Fluids: 834mL  Urine Output:: 457mL  Drains or Tubes: none  Implants: none  Specimens Removed: left fallopian tube  Complications: none  Intraoperative Findings: Bilateral hydrosalpinx with complete fusion of the fimbria.  Chromopertubation showed spill of dye from the right tube there was no spill from the patient's left tube which was adherent to the left pelvic sidewall.  The left tube was completely excised.  A simple 2x3cm cyst containing serous fluid was lysed on the patient's right.  There was no intraoperative findings suggestive of endometriosis or Fitzhugh curtis ahesions.    Patient Condition: stable  Procedure in Detail:  Patient was taken to the operating room where she was administered general anesthesia.  She was positioned in the dorsal lithotomy position utilizing Allen stirups, prepped and draped in the usual sterile fashion.  Prior to proceeding with procedure a time out was performed.  Attention was turned to the patient's pelvis.  A red rubber catheter was used to empty the patient's bladder.  An operative speculum was placed to allow visualization of the cervix.  The anterior lip of the cervix was grasped with a single tooth tenaculum, and a Zoomy uterine manipulator was placed to allow manipulation of the uterus.  The operative speculum and single tooth tenaculum were then removed.  Attention was turned to the patient's abdomen.  The umbilicus was infiltrated with 1% Sensorcaine, before making a stab incision using an 11 blade scalpel.  A 91mm Excel trocar was  then used to gain direct entry into the peritoneal cavity utilizing the camera to visualize progress of the trocar during placement.  Once peritoneal entry had been achieved, insufflation was started and pneumoperitoneum established at a pressure of 23mmHg.   General inspection of the abdomen revealed the above noted findings.  A 26mm left lower quadrant and 22mm right lower quadrant assistant port were then placed under direct visualization.  Chromopertubation was undertaken noting spill of dilute methylene blue from the patient's right tube no spill was evidence on the left.  Both tubes appeared to have a mild to moderate degree of hydrosalpinx.  The left tube was excised while the right tube which had evidence of spill was retained.  There was a right ovarian cyst which was lysed noting serous fluid.    The methylene blue was evacuated from the pelvis using a suction irrigator.  Pneumoperitoneum was evacuated.  The trocars were removed.  The 22mm trocar site was closed with 4-0 Monocryl in a subcuticular fashion.  All trocar sites were then dressed with surgical skin glue.  The Zoomy uterine manipulator was removed.  Sponge needle and instrument counts were correct time two.  The patient tolerated the procedure well and was taken to the recovery room in stable condition.

## 2015-02-27 LAB — SURGICAL PATHOLOGY

## 2015-10-28 ENCOUNTER — Encounter: Payer: Self-pay | Admitting: Emergency Medicine

## 2015-10-28 ENCOUNTER — Emergency Department
Admission: EM | Admit: 2015-10-28 | Discharge: 2015-10-28 | Disposition: A | Discharge status: Home / Self Care | Payer: No Typology Code available for payment source | Attending: Emergency Medicine | Admitting: Emergency Medicine

## 2015-10-28 DIAGNOSIS — S2020XA Contusion of thorax, unspecified, initial encounter: Secondary | ICD-10-CM | POA: Insufficient documentation

## 2015-10-28 DIAGNOSIS — Z79899 Other long term (current) drug therapy: Secondary | ICD-10-CM | POA: Insufficient documentation

## 2015-10-28 DIAGNOSIS — Y9389 Activity, other specified: Secondary | ICD-10-CM | POA: Insufficient documentation

## 2015-10-28 DIAGNOSIS — S8002XA Contusion of left knee, initial encounter: Secondary | ICD-10-CM | POA: Diagnosis not present

## 2015-10-28 DIAGNOSIS — Y92410 Unspecified street and highway as the place of occurrence of the external cause: Secondary | ICD-10-CM | POA: Diagnosis not present

## 2015-10-28 DIAGNOSIS — Z792 Long term (current) use of antibiotics: Secondary | ICD-10-CM | POA: Insufficient documentation

## 2015-10-28 DIAGNOSIS — F17210 Nicotine dependence, cigarettes, uncomplicated: Secondary | ICD-10-CM | POA: Insufficient documentation

## 2015-10-28 DIAGNOSIS — S40022A Contusion of left upper arm, initial encounter: Secondary | ICD-10-CM | POA: Diagnosis not present

## 2015-10-28 DIAGNOSIS — Y999 Unspecified external cause status: Secondary | ICD-10-CM | POA: Insufficient documentation

## 2015-10-28 DIAGNOSIS — S299XXA Unspecified injury of thorax, initial encounter: Secondary | ICD-10-CM | POA: Diagnosis present

## 2015-10-28 DIAGNOSIS — M79601 Pain in right arm: Secondary | ICD-10-CM | POA: Insufficient documentation

## 2015-10-28 DIAGNOSIS — Z791 Long term (current) use of non-steroidal anti-inflammatories (NSAID): Secondary | ICD-10-CM | POA: Insufficient documentation

## 2015-10-28 DIAGNOSIS — M7918 Myalgia, other site: Secondary | ICD-10-CM

## 2015-10-28 MED ORDER — IBUPROFEN 600 MG PO TABS
600.0000 mg | ORAL_TABLET | Freq: Once | ORAL | Status: AC
Start: 2015-10-28 — End: 2015-10-28
  Administered 2015-10-28: 600 mg via ORAL
  Filled 2015-10-28: qty 1

## 2015-10-28 MED ORDER — OXYCODONE-ACETAMINOPHEN 5-325 MG PO TABS
1.0000 | ORAL_TABLET | Freq: Once | ORAL | Status: AC
  Administered 2015-10-28: 1 via ORAL
  Filled 2015-10-28: qty 1

## 2015-10-28 MED ORDER — TRAMADOL HCL 50 MG PO TABS: 50.0000 mg | ORAL_TABLET | Freq: Four times a day (QID) | ORAL | 0 refills | Status: DC | PRN

## 2015-10-28 MED ORDER — IBUPROFEN 600 MG PO TABS: 600.0000 mg | ORAL_TABLET | Freq: Three times a day (TID) | ORAL | 0 refills | Status: DC | PRN

## 2015-10-28 MED ORDER — CYCLOBENZAPRINE HCL 10 MG PO TABS
10.0000 mg | ORAL_TABLET | Freq: Once | ORAL | Status: AC
  Administered 2015-10-28: 10 mg via ORAL
  Filled 2015-10-28: qty 1

## 2015-10-28 MED ORDER — CYCLOBENZAPRINE HCL 10 MG PO TABS: 10.0000 mg | ORAL_TABLET | Freq: Three times a day (TID) | ORAL | 0 refills | Status: DC | PRN
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
## 2015-10-30 ENCOUNTER — Emergency Department
Admission: EM | Admit: 2015-10-30 | Discharge: 2015-10-30 | Disposition: A | Discharge status: Home / Self Care | Payer: No Typology Code available for payment source | Attending: Emergency Medicine | Admitting: Emergency Medicine

## 2015-10-30 ENCOUNTER — Encounter: Payer: Self-pay | Admitting: Medical Oncology

## 2015-10-30 DIAGNOSIS — Z79899 Other long term (current) drug therapy: Secondary | ICD-10-CM | POA: Diagnosis not present

## 2015-10-30 DIAGNOSIS — S39012A Strain of muscle, fascia and tendon of lower back, initial encounter: Secondary | ICD-10-CM

## 2015-10-30 DIAGNOSIS — Y999 Unspecified external cause status: Secondary | ICD-10-CM | POA: Insufficient documentation

## 2015-10-30 DIAGNOSIS — S3992XA Unspecified injury of lower back, initial encounter: Secondary | ICD-10-CM | POA: Diagnosis present

## 2015-10-30 DIAGNOSIS — F17210 Nicotine dependence, cigarettes, uncomplicated: Secondary | ICD-10-CM | POA: Insufficient documentation

## 2015-10-30 DIAGNOSIS — Y939 Activity, unspecified: Secondary | ICD-10-CM | POA: Insufficient documentation

## 2015-10-30 DIAGNOSIS — Z791 Long term (current) use of non-steroidal anti-inflammatories (NSAID): Secondary | ICD-10-CM | POA: Diagnosis not present

## 2015-10-30 DIAGNOSIS — Y92410 Unspecified street and highway as the place of occurrence of the external cause: Secondary | ICD-10-CM | POA: Insufficient documentation

## 2015-10-30 DIAGNOSIS — Z792 Long term (current) use of antibiotics: Secondary | ICD-10-CM | POA: Diagnosis not present

## 2015-10-30 MED ORDER — IBUPROFEN 600 MG PO TABS
600.0000 mg | ORAL_TABLET | Freq: Once | ORAL | Status: AC
  Administered 2015-10-30: 600 mg via ORAL
  Filled 2015-10-30: qty 1

## 2015-10-30 MED ORDER — TRAMADOL HCL 50 MG PO TABS
50.0000 mg | ORAL_TABLET | Freq: Once | ORAL | Status: AC
  Administered 2015-10-30: 50 mg via ORAL
  Filled 2015-10-30: qty 1

## 2015-10-30 MED ORDER — CYCLOBENZAPRINE HCL 10 MG PO TABS
10.0000 mg | ORAL_TABLET | Freq: Once | ORAL | Status: AC
  Administered 2015-10-30: 10 mg via ORAL
  Filled 2015-10-30: qty 1
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
## 2015-10-30 NOTE — Discharge Instructions (Signed)
Advised to fill prescription take medication as directed.
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
## 2016-01-15 ENCOUNTER — Encounter: Payer: Self-pay | Admitting: Emergency Medicine

## 2016-01-15 ENCOUNTER — Emergency Department
Admission: EM | Admit: 2016-01-15 | Discharge: 2016-01-15 | Disposition: A | Discharge status: Home / Self Care | Payer: Medicaid Other | Attending: Emergency Medicine | Admitting: Emergency Medicine

## 2016-01-15 ENCOUNTER — Emergency Department: Payer: Medicaid Other

## 2016-01-15 DIAGNOSIS — F17210 Nicotine dependence, cigarettes, uncomplicated: Secondary | ICD-10-CM | POA: Insufficient documentation

## 2016-01-15 DIAGNOSIS — R102 Pelvic and perineal pain: Secondary | ICD-10-CM | POA: Insufficient documentation

## 2016-01-15 DIAGNOSIS — F1290 Cannabis use, unspecified, uncomplicated: Secondary | ICD-10-CM | POA: Insufficient documentation

## 2016-01-15 DIAGNOSIS — Z79899 Other long term (current) drug therapy: Secondary | ICD-10-CM | POA: Insufficient documentation

## 2016-01-15 DIAGNOSIS — N7011 Chronic salpingitis: Secondary | ICD-10-CM | POA: Insufficient documentation

## 2016-01-15 LAB — COMPREHENSIVE METABOLIC PANEL
ALBUMIN: 4 g/dL (ref 3.5–5.0)
ALK PHOS: 75 U/L (ref 38–126)
ALT: 10 U/L — AB (ref 14–54)
ANION GAP: 5 (ref 5–15)
AST: 16 U/L (ref 15–41)
BUN: 7 mg/dL (ref 6–20)
CO2: 27 mmol/L (ref 22–32)
CREATININE: 0.9 mg/dL (ref 0.44–1.00)
Calcium: 9.2 mg/dL (ref 8.9–10.3)
Chloride: 106 mmol/L (ref 101–111)
GFR calc non Af Amer: >60 mL/min (ref >60)
GLUCOSE: 102 mg/dL — AB (ref 65–99)
Potassium: 3.8 mmol/L (ref 3.5–5.1)
SODIUM: 138 mmol/L (ref 135–145)
Total Bilirubin: 0.3 mg/dL (ref 0.3–1.2)
Total Protein: 7.7 g/dL (ref 6.5–8.1)

## 2016-01-15 LAB — URINALYSIS, COMPLETE (UACMP) WITH MICROSCOPIC
Bacteria, UA: NONE SEEN
Bilirubin Urine: NEGATIVE
Glucose, UA: NEGATIVE mg/dL
KETONES UR: NEGATIVE mg/dL
Nitrite: NEGATIVE
PROTEIN: 100 mg/dL — AB
Specific Gravity, Urine: 1.024 (ref 1.005–1.030)
pH: 5 (ref 5.0–8.0)

## 2016-01-15 LAB — CBC
HCT: 42.1 % (ref 35.0–47.0)
HEMOGLOBIN: 14.2 g/dL (ref 12.0–16.0)
MCH: 30.3 pg (ref 26.0–34.0)
MCHC: 33.6 g/dL (ref 32.0–36.0)
MCV: 90.4 fL (ref 80.0–100.0)
Platelets: 383 10*3/uL (ref 150–440)
RBC: 4.66 MIL/uL (ref 3.80–5.20)
RDW: 12.8 % (ref 11.5–14.5)
WBC: 15.7 10*3/uL — ABNORMAL HIGH (ref 3.6–11.0)

## 2016-01-15 LAB — LIPASE, BLOOD: LIPASE: 20 U/L (ref 11–51)

## 2016-01-15 LAB — PREGNANCY, URINE: PREG TEST UR: NEGATIVE

## 2016-01-15 MED ORDER — ONDANSETRON HCL 4 MG/2ML IJ SOLN
4.0000 mg | Freq: Once | INTRAMUSCULAR | Status: AC
  Administered 2016-01-15: 4 mg via INTRAVENOUS
  Filled 2016-01-15: qty 2

## 2016-01-15 MED ORDER — TRAMADOL HCL 50 MG PO TABS: 50.0000 mg | ORAL_TABLET | Freq: Four times a day (QID) | ORAL | 0 refills | Status: AC | PRN

## 2016-01-15 MED ORDER — KETOROLAC TROMETHAMINE 30 MG/ML IJ SOLN
30.0000 mg | Freq: Once | INTRAMUSCULAR | Status: AC
  Administered 2016-01-15: 30 mg via INTRAMUSCULAR
  Filled 2016-01-15: qty 1

## 2016-01-15 MED ORDER — MORPHINE SULFATE (PF) 4 MG/ML IV SOLN
4.0000 mg | Freq: Once | INTRAVENOUS | Status: AC
  Administered 2016-01-15: 4 mg via INTRAVENOUS
  Filled 2016-01-15: qty 1

## 2016-01-15 NOTE — ED Triage Notes (Signed)
Pt to ed with c/o abd pain and cramping that started last night.  Pt states she is on her period and the cramping is more intense than usual.

## 2016-01-15 NOTE — ED Notes (Signed)
Pt verbalizes understanding of discharge instructions.

## 2016-01-15 NOTE — Discharge Instructions (Signed)
Please follow up with Dr. Georgianne Fick for further evaluation

## 2016-01-15 NOTE — ED Notes (Signed)
[id number] (mother number)

## 2016-01-15 NOTE — ED Provider Notes (Addendum)
[facility] Emergency Department Provider Note   ____________________________________________    I have reviewed the triage vital signs and the nursing notes.   HISTORY  Chief Complaint Abdominal Pain     HPI Kelsey Hopkins is a 31 y.o. female who complains of pelvic cramping. Patient reports she is currently menstruating but reports she is having pelvic cramping that is worse than usual for her. She denies fevers or chills. She denies nausea or vomiting. She complains of moderate to severe cramping in her pelvis bilaterally. She was a history of pelvic surgery where her fallopian tube was removed.   Past Medical History:  Diagnosis Date  . Medical history non-contributory     There are no active problems to display for this patient.   Past Surgical History:  Procedure Laterality Date  . CHROMOPERTUBATION N/A 02/26/2015   Procedure: CHROMOPERTUBATION;  Surgeon: Malachy Mood, MD;  Location: [facility] ORS;  Service: Gynecology;  Laterality: N/A;  . LAPAROSCOPIC UNILATERAL SALPINGECTOMY Right 02/26/2015   Procedure: LAPAROSCOPIC UNILATERAL SALPINGECTOMY;  Surgeon: Malachy Mood, MD;  Location: [facility] ORS;  Service: Gynecology;  Laterality: Right;  . NO PAST SURGERIES      Prior to Admission medications   Medication Sig Start Date End Date Taking? Authorizing Provider  cyclobenzaprine (FLEXERIL) 10 MG tablet Take 1 tablet (10 mg total) by mouth 3 (three) times daily as needed. 10/28/15   Sable Feil, PA-C  doxycycline (VIBRAMYCIN) 100 MG capsule Take 1 capsule (100 mg total) by mouth 2 (two) times daily. 02/26/15   Malachy Mood, MD  medroxyPROGESTERone (PROVERA) 10 MG tablet Take 1 tablet (10 mg total) by mouth daily. 01/27/15   Paulette Blanch, MD  traMADol (ULTRAM) 50 MG tablet Take 1 tablet (50 mg total) by mouth every 6 (six) hours as needed. 01/15/16 01/14/17  Lavonia Drafts, MD     Allergies Patient has no known allergies.  History reviewed.  No pertinent family history.  Social History Social History  Substance Use Topics  . Smoking status: Current Every Day Smoker    Packs/day: 1.50    Types: Cigarettes  . Smokeless tobacco: Never Used  . Alcohol use No    Review of Systems  Constitutional: No fever/chills   Gastrointestinal: No abdominal pain.  No nausea, no vomiting.   Genitourinary: Negative for dysuria. As above Musculoskeletal: Negative for back pain. Skin: Negative for rash. Neurological: Negative for headaches   10-point ROS otherwise negative.  ____________________________________________   PHYSICAL EXAM:  VITAL SIGNS: ED Triage Vitals  Enc Vitals Group     BP 01/15/16 1258 (!) 157/97     Pulse Rate 01/15/16 1258 77     Resp 01/15/16 1258 20     Temp 01/15/16 1258 98.1 F (36.7 C)     Temp Source 01/15/16 1258 Oral     SpO2 01/15/16 1258 100 %     Weight 01/15/16 1259 250 lb (113.4 kg)     Height 01/15/16 1259 5\' 11"  (1.803 m)     Head Circumference --      Peak Flow --      Pain Score 01/15/16 1259 10     Pain Loc --      Pain Edu? --      Excl. in [place]? --     Constitutional: Alert and oriented. No acute distress.  Eyes: Conjunctivae are normal.    Mouth/Throat: Mucous membranes are moist.    Cardiovascular: Normal rate, regular rhythm. Grossly normal heart sounds.  Good peripheral circulation. Respiratory: Normal respiratory effort.  No retractions. Lungs CTAB. Gastrointestinal: Soft and nontender. No distention.  No CVA tenderness. Genitourinary: Pelvic exam limited by menstruation however no obvious cervicitis, no CMT Musculoskeletal: No lower extremity tenderness nor edema.  Warm and well perfused Neurologic:  Normal speech and language. No gross focal neurologic deficits are appreciated.  Skin:  Skin is warm, dry and intact. No rash noted. Psychiatric: Mood and affect are normal. Speech and behavior are normal.  ____________________________________________   LABS (all labs  ordered are listed, but only abnormal results are displayed)  Labs Reviewed  COMPREHENSIVE METABOLIC PANEL - Abnormal; Notable for the following:       Result Value   Glucose, Bld 102 (*)    ALT 10 (*)    All other components within normal limits  CBC - Abnormal; Notable for the following:    WBC 15.7 (*)    All other components within normal limits  URINALYSIS, COMPLETE (UACMP) WITH MICROSCOPIC - Abnormal; Notable for the following:    Color, Urine RED (*)    APPearance CLOUDY (*)    Hgb urine dipstick LARGE (*)    Protein, ur 100 (*)    Leukocytes, UA SMALL (*)    Squamous Epithelial / LPF 0-5 (*)    All other components within normal limits  LIPASE, BLOOD  PREGNANCY, URINE   ____________________________________________  EKG  None ____________________________________________  RADIOLOGY  Hydrosalpinx not significant changed ____________________________________________   PROCEDURES  Procedure(s) performed: No    Critical Care performed: No ____________________________________________   INITIAL IMPRESSION / ASSESSMENT AND PLAN / ED COURSE  Pertinent labs & imaging results that were available during my care of the patient were reviewed by me and considered in my medical decision making (see chart for details).  Patient presents with bilateral pelvic discomfort, this could be related to dysmenorrhea however she states this is worse than she is used to. We will treat with Toradol and obtain ultrasound as well. Pelvic exam is reassuring.She does have an elevation in her white blood cell count however no evidence of infection, abdominal exam is overall benign and she has no respiratory symptoms nor dysuria.  Clinical Course   Ultrasound demonstrates right-sided hydrosalpinx which is not significantly changed. Patient reports feeling better. We will discharge her with follow-up with Dr. Star Age. Return precautions  discussed. ____________________________________________   FINAL CLINICAL IMPRESSION(S) / ED DIAGNOSES  Final diagnoses:  Pelvic pain  Hydrosalpinx      NEW MEDICATIONS STARTED DURING THIS VISIT:  Discharge Medication List as of 01/15/2016  8:04 PM       Note:  This document was prepared using Dragon voice recognition software and may include unintentional dictation errors.    Lavonia Drafts, MD 01/15/16 [id number]    Lavonia Drafts, MD 01/15/16 2209

## 2016-03-16 ENCOUNTER — Ambulatory Visit: Payer: Self-pay | Admitting: Obstetrics and Gynecology

## 2016-04-04 ENCOUNTER — Ambulatory Visit (INDEPENDENT_AMBULATORY_CARE_PROVIDER_SITE_OTHER): Payer: Self-pay | Admitting: Advanced Practice Midwife

## 2016-04-04 ENCOUNTER — Encounter: Payer: Self-pay | Admitting: Advanced Practice Midwife

## 2016-04-04 VITALS — BP 122/70 | Ht 71.0 in | Wt 261.0 lb

## 2016-04-04 DIAGNOSIS — N921 Excessive and frequent menstruation with irregular cycle: Secondary | ICD-10-CM

## 2016-04-04 DIAGNOSIS — R102 Pelvic and perineal pain: Secondary | ICD-10-CM

## 2016-04-05 NOTE — Progress Notes (Addendum)
S: Pt is here today because she has been bleeding for most of the month of March and bleeding is continuing into April. The bleeding is described as heavy at times and other times it is present only when she wipes. She was seen in January at the ER for pain in her lower abdomen. Labs were done. The pain has continued since then and the bleeding has been irregular and increased since then. She usually has a regular monthly period that lasts for 5-7 days and is moderate in flow. 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 She would like to follow up with Dr Georgianne Fick and until that appointment she will try oral birth control pills to control the bleeding.   O: Well appearing female in no acute distress BP 122/70   Ht 5\' 11"  (1.803 m)   Wt 261 lb (118.4 kg)   LMP 04/01/2016   BMI 36.40 kg/m   No exam today- only consult  A: 30 year old female with menometrorrhagia  P: GYN ultrasound and follow up with Dr Georgianne Fick Sample pack of Lo LoestrinFe given   Rod Can, CNM

## 2016-04-18 ENCOUNTER — Encounter: Payer: Self-pay | Admitting: Obstetrics and Gynecology

## 2016-04-18 ENCOUNTER — Ambulatory Visit: Payer: Medicaid Other | Admitting: Obstetrics and Gynecology

## 2016-04-18 ENCOUNTER — Other Ambulatory Visit: Payer: Medicaid Other

## 2017-04-27 ENCOUNTER — Emergency Department: Payer: Self-pay

## 2017-04-27 ENCOUNTER — Emergency Department
Admission: EM | Admit: 2017-04-27 | Discharge: 2017-04-27 | Disposition: A | Discharge status: Home / Self Care | Payer: Self-pay | Attending: Emergency Medicine | Admitting: Emergency Medicine

## 2017-04-27 ENCOUNTER — Other Ambulatory Visit: Payer: Self-pay

## 2017-04-27 DIAGNOSIS — R600 Localized edema: Secondary | ICD-10-CM | POA: Insufficient documentation

## 2017-04-27 DIAGNOSIS — M79661 Pain in right lower leg: Secondary | ICD-10-CM | POA: Insufficient documentation

## 2017-04-27 DIAGNOSIS — R0602 Shortness of breath: Secondary | ICD-10-CM | POA: Insufficient documentation

## 2017-04-27 DIAGNOSIS — M79662 Pain in left lower leg: Secondary | ICD-10-CM | POA: Insufficient documentation

## 2017-04-27 DIAGNOSIS — F17210 Nicotine dependence, cigarettes, uncomplicated: Secondary | ICD-10-CM | POA: Insufficient documentation

## 2017-04-27 DIAGNOSIS — R079 Chest pain, unspecified: Secondary | ICD-10-CM

## 2017-04-27 DIAGNOSIS — R0789 Other chest pain: Secondary | ICD-10-CM | POA: Insufficient documentation

## 2017-04-27 LAB — COMPREHENSIVE METABOLIC PANEL
ALT: 10 U/L — ABNORMAL LOW (ref 14–54)
ANION GAP: 4 — AB (ref 5–15)
AST: 15 U/L (ref 15–41)
Albumin: 4 g/dL (ref 3.5–5.0)
Alkaline Phosphatase: 71 U/L (ref 38–126)
BUN: 11 mg/dL (ref 6–20)
CHLORIDE: 105 mmol/L (ref 101–111)
CO2: 26 mmol/L (ref 22–32)
Calcium: 8.6 mg/dL — ABNORMAL LOW (ref 8.9–10.3)
Creatinine, Ser: 0.71 mg/dL (ref 0.44–1.00)
GFR calc Af Amer: >60 mL/min (ref >60)
GFR calc non Af Amer: >60 mL/min (ref >60)
Glucose, Bld: 108 mg/dL — ABNORMAL HIGH (ref 65–99)
Potassium: 3.7 mmol/L (ref 3.5–5.1)
SODIUM: 135 mmol/L (ref 135–145)
Total Bilirubin: 0.4 mg/dL (ref 0.3–1.2)
Total Protein: 7.6 g/dL (ref 6.5–8.1)

## 2017-04-27 LAB — CBC WITH DIFFERENTIAL/PLATELET
Band Neutrophils: 0 %
Basophils Absolute: 0.1 10*3/uL (ref 0–0.1)
Basophils Relative: 1 %
Blasts: 0 %
Eosinophils Absolute: 0.6 10*3/uL (ref 0–0.7)
Eosinophils Relative: 6 %
HCT: 35.7 % (ref 35.0–47.0)
Hemoglobin: 12 g/dL (ref 12.0–16.0)
LYMPHS ABS: 4.4 10*3/uL — AB (ref 1.0–3.6)
Lymphocytes Relative: 41 %
MCH: 30 pg (ref 26.0–34.0)
MCHC: 33.6 g/dL (ref 32.0–36.0)
MCV: 89.4 fL (ref 80.0–100.0)
MONOS PCT: 4 %
Metamyelocytes Relative: 0 %
Monocytes Absolute: 0.4 10*3/uL (ref 0.2–0.9)
Myelocytes: 0 %
NEUTROS ABS: 5.2 10*3/uL (ref 1.4–6.5)
NEUTROS PCT: 48 %
NRBC: 0 /100{WBCs}
OTHER: 0 %
PLATELETS: 353 10*3/uL (ref 150–440)
Promyelocytes Relative: 0 %
RBC: 4 MIL/uL (ref 3.80–5.20)
RDW: 15 % — ABNORMAL HIGH (ref 11.5–14.5)
WBC: 10.7 10*3/uL (ref 3.6–11.0)

## 2017-04-27 LAB — FIBRIN DERIVATIVES D-DIMER (ARMC ONLY): FIBRIN DERIVATIVES D-DIMER (ARMC): 387.09 ng{FEU}/mL (ref 0.00–499.00)

## 2017-04-27 LAB — T4, FREE: Free T4: 1.15 ng/dL — ABNORMAL HIGH (ref 0.61–1.12)

## 2017-04-27 LAB — TROPONIN I: Troponin I: <0.03 ng/mL (ref <0.03)

## 2017-04-27 LAB — TSH: TSH: 0.881 u[IU]/mL (ref 0.350–4.500)

## 2017-04-27 NOTE — Discharge Instructions (Signed)
Your blood tests today were unremarkable.  Your chest xray and leg ultrasounds were also normal. Your thyroid function was normal today.

## 2017-04-27 NOTE — ED Triage Notes (Signed)
Pt c/o lower ext swelling that has been x1 week - pt c/o soreness in lower ext - pt c/o pain with ambulation - pain is worse in right leg - no redness or warmth to lower ext noted

## 2017-04-27 NOTE — ED Notes (Signed)
Urine POC negative. 

## 2017-04-27 NOTE — ED Provider Notes (Signed)
[facility] Emergency Department Provider Note  ____________________________________________  Time seen: Approximately 9:43 AM  I have reviewed the triage vital signs and the nursing notes.   HISTORY  Chief Complaint Leg Swelling    HPI Kelsey Hopkins is a 32 y.o. female who complains of pain and swelling of bilateral lower extremities for the past week. gradual onset, constant, no aggravating or alleviating factors. Nonradiating. Located mainly in the calves. Associated with shortness of breath. She has occasional chest pain that is sharp and lasts for 1 second. Denies pleuritic pain. No exertional chest pain. No radiating chest pain. No vomiting or diaphoresis. No fevers or chills.  Reports a history of leg swelling one year ago after a long car trip. She did not seek care at that time. Denies any history of DVT. No recent travel, hospitalization or surgeries.  Family history includes thyroid disorder.  patient reports overall she is not really concerned about the symptoms, but she came in at the urging of her family member who wanted her to be evaluated.   Past Medical History:  Diagnosis Date  . Medical history non-contributory   no known medical problems   There are no active problems to display for this patient.    Past Surgical History:  Procedure Laterality Date  . CHROMOPERTUBATION N/A 02/26/2015   Procedure: CHROMOPERTUBATION;  Surgeon: Malachy Mood, MD;  Location: [facility] ORS;  Service: Gynecology;  Laterality: N/A;  . LAPAROSCOPIC UNILATERAL SALPINGECTOMY Right 02/26/2015   Procedure: LAPAROSCOPIC UNILATERAL SALPINGECTOMY;  Surgeon: Malachy Mood, MD;  Location: [facility] ORS;  Service: Gynecology;  Laterality: Right;  . NO PAST SURGERIES       Prior to Admission medications   Not on File  none Review of EMR shows possible OCP use   Allergies Patient has no known allergies.   No family history on file. thyroid disorder Social  History Social History   Tobacco Use  . Smoking status: Current Every Day Smoker    Packs/day: 0.50    Types: Cigarettes  . Smokeless tobacco: Never Used  Substance Use Topics  . Alcohol use: Yes    Comment: socially  . Drug use: Yes    Types: Marijuana    Comment: every day    Review of Systems  Constitutional:   No fever or chills.  ENT:   No sore throat. No rhinorrhea. Cardiovascular:   positive as above for atypical chest pain without syncope. Respiratory:   positive as above for shortness of breath without cough. Gastrointestinal:   Negative for abdominal pain, vomiting and diarrhea.  Musculoskeletal:   positive as above for bilateral lower leg pain and swelling All other systems reviewed and are negative except as documented above in ROS and HPI.  ____________________________________________   PHYSICAL EXAM:  VITAL SIGNS: ED Triage Vitals  Enc Vitals Group     BP 04/27/17 0908 134/80     Pulse Rate 04/27/17 0908 93     Resp 04/27/17 0908 15     Temp 04/27/17 0908 97.8 F (36.6 C)     Temp Source 04/27/17 0908 Oral     SpO2 04/27/17 0908 95 %     Weight 04/27/17 0909 286 lb (129.7 kg)     Height 04/27/17 0909 5\' 11"  (1.803 m)     Head Circumference --      Peak Flow --      Pain Score 04/27/17 0908 8     Pain Loc --      Pain Edu? --  Excl. in [place]? --     Vital signs reviewed, nursing assessments reviewed.   Constitutional:   Alert and oriented. Well appearing and in no distress. Eyes:   Conjunctivae are normal. EOMI. PERRL.no periorbital edema ENT      Head:   Normocephalic and atraumatic.      Nose:   No congestion/rhinnorhea.       Mouth/Throat:   MMM, no pharyngeal erythema. No peritonsillar mass.       Neck:   No meningismus. Full ROM.enlarged thyroid Hematological/Lymphatic/Immunilogical:   No cervical lymphadenopathy. Cardiovascular:   RRR. Symmetric bilateral radial and DP pulses.  No murmurs.  Respiratory:   Normal respiratory effort  without tachypnea/retractions. Breath sounds are clear and equal bilaterally. No wheezes/rales/rhonchi. Gastrointestinal:   Soft and nontender. Non distended. There is no CVA tenderness.  No rebound, rigidity, or guarding.  Musculoskeletal:   Normal range of motion in all extremities. No joint effusions.  tenderness in bilateral calves. 1+ nonpitting edema bilateral lower extremities, left greater than right.no upper extremity edema Neurologic:   Normal speech and language.  Motor grossly intact. No acute focal neurologic deficits are appreciated.  Skin:    Skin is warm, dry and intact. No rash noted.  No petechiae, purpura, or bullae.  ____________________________________________    LABS (pertinent positives/negatives) (all labs ordered are listed, but only abnormal results are displayed) Labs Reviewed  CBC WITH DIFFERENTIAL/PLATELET - Abnormal; Notable for the following components:      Result Value   RDW 15.0 (*)    Lymphs Abs 4.4 (*)    All other components within normal limits  COMPREHENSIVE METABOLIC PANEL - Abnormal; Notable for the following components:   Glucose, Bld 108 (*)    Calcium 8.6 (*)    ALT 10 (*)    Anion gap 4 (*)    All other components within normal limits  T4, FREE - Abnormal; Notable for the following components:   Free T4 1.15 (*)    All other components within normal limits  TROPONIN I  FIBRIN DERIVATIVES D-DIMER ([facility] ONLY)  TSH  URINALYSIS, COMPLETE (UACMP) WITH MICROSCOPIC  POC URINE PREG, ED   ____________________________________________   EKG  interpreted by me  Date: 04/27/2017  Rate: 86  Rhythm: normal sinus rhythm  QRS Axis: normal  Intervals: normal  ST/T Wave abnormalities: normal  Conduction Disutrbances: none  Narrative Interpretation: unremarkable  repeat EKG at 11:31 AM interpreted by me, sinus rhythm rate of 87, normal axis and intervals, no acute ischemic changes. No interval  change.    ____________________________________________    RADIOLOGY  Dg Chest 2 View  Result Date: 04/27/2017 CLINICAL DATA:  Lower extremity swelling EXAM: CHEST - 2 VIEW COMPARISON:  04/04/2004 FINDINGS: Heart and mediastinal contours are within normal limits. No focal opacities or effusions. No acute bony abnormality. IMPRESSION: No active cardiopulmonary disease. Electronically Signed   By: Rolm Baptise M.D.   On: 04/27/2017 10:19   US Venous Img Lower Bilateral  Result Date: 04/27/2017 CLINICAL DATA:  Edema and pain x1 week EXAM: BILATERAL LOWER EXTREMITY VENOUS DOPPLER ULTRASOUND TECHNIQUE: Gray-scale sonography with compression, as well as color and duplex ultrasound, were performed to evaluate the deep venous system from the level of the common femoral vein through the popliteal and proximal calf veins. COMPARISON:  None FINDINGS: Normal compressibility of the common femoral, superficial femoral, and popliteal veins, as well as the proximal calf veins. No filling defects to suggest DVT on grayscale or color Doppler imaging. Doppler  waveforms show normal direction of venous flow, normal respiratory phasicity and response to augmentation. IMPRESSION: No evidence of  lower extremity deep vein thrombosis, bilaterally. Electronically Signed   By: Lucrezia Europe M.D.   On: 04/27/2017 10:56    ____________________________________________   PROCEDURES Procedures  ____________________________________________  DIFFERENTIAL DIAGNOSIS   DVT, venous insufficiency, hypothyroidism, heart failure  CLINICAL IMPRESSION / ASSESSMENT AND PLAN / ED COURSE  Pertinent labs & imaging results that were available during my care of the patient were reviewed by me and considered in my medical decision making (see chart for details).   presentation consistent with anasarca or nephrotic syndrome or underlying rheumatologic disorder.   Clinical Course as of Apr 27 1140  Thu Apr 27, 2017  8250 patient  presents with bilateral lower extremity edema and calf tenderness. Not consistent with infection such as cellulitis abscess necrotizing fasciitis or a cellulitis. I doubt bony fracture or joint dislocation. Concern for DVT. The rest of her exam also raises concerns on differential diagnosis for hypothyroidism, pulmonary embolism, heart failure. I will check labs including TSH, chest x-ray ultrasound venous bilateral, troponin and d-dimer. Given the subacute to chronic, fleeting atypical chest pain that she has been having, a single troponin is sufficient to evaluate for MI.   [PS]  1106 Cxr and BLE venous US unremarkable. No evidence of pulm edema, ptx, or dvt.    [PS]  1139 Labs unremarkable. TFTs wnl. D dimer negative. Trop negative. Overall workup is very reassuring. Will plan to DC to outpatient follow up. Return precautions for any new or worsened symptoms.    [PS]    Clinical Course User Index [PS] Carrie Mew, MD     ____________________________________________   FINAL CLINICAL IMPRESSION(S) / ED DIAGNOSES    Final diagnoses:  Bilateral lower extremity edema  Nonspecific chest pain     ED Discharge Orders    None      Portions of this note were generated with dragon dictation software. Dictation errors may occur despite best attempts at proofreading.    Carrie Mew, MD 04/27/17 [phone]

## 2017-04-27 NOTE — ED Triage Notes (Signed)
First nurse:  Patient has swollen lower extremities for about a week.  Now they are hurting.

## 2017-04-27 NOTE — ED Notes (Signed)
Attempted recollect for D-dimer x 2 per this RN.  Lab notified to collect repeat.

## 2017-04-27 NOTE — ED Notes (Signed)
Patient transported to X-ray 

## 2017-05-13 ENCOUNTER — Emergency Department: Payer: Self-pay

## 2017-05-13 ENCOUNTER — Encounter: Payer: Self-pay | Admitting: Emergency Medicine

## 2017-05-13 ENCOUNTER — Emergency Department
Admission: EM | Admit: 2017-05-13 | Discharge: 2017-05-13 | Disposition: A | Discharge status: Home / Self Care | Payer: Self-pay | Attending: Student in an Organized Health Care Education/Training Program | Admitting: Student in an Organized Health Care Education/Training Program

## 2017-05-13 DIAGNOSIS — M25571 Pain in right ankle and joints of right foot: Secondary | ICD-10-CM | POA: Insufficient documentation

## 2017-05-13 DIAGNOSIS — F17210 Nicotine dependence, cigarettes, uncomplicated: Secondary | ICD-10-CM | POA: Insufficient documentation

## 2017-05-13 DIAGNOSIS — M25572 Pain in left ankle and joints of left foot: Secondary | ICD-10-CM | POA: Insufficient documentation

## 2017-05-13 DIAGNOSIS — R600 Localized edema: Secondary | ICD-10-CM | POA: Insufficient documentation

## 2017-05-13 DIAGNOSIS — R609 Edema, unspecified: Secondary | ICD-10-CM

## 2017-05-13 LAB — COMPREHENSIVE METABOLIC PANEL
ALBUMIN: 3.8 g/dL (ref 3.5–5.0)
ALT: 11 U/L — ABNORMAL LOW (ref 14–54)
AST: 17 U/L (ref 15–41)
Alkaline Phosphatase: 72 U/L (ref 38–126)
Anion gap: 4 — ABNORMAL LOW (ref 5–15)
BUN: 8 mg/dL (ref 6–20)
CHLORIDE: 107 mmol/L (ref 101–111)
CO2: 28 mmol/L (ref 22–32)
Calcium: 8.7 mg/dL — ABNORMAL LOW (ref 8.9–10.3)
Creatinine, Ser: 0.72 mg/dL (ref 0.44–1.00)
GFR calc Af Amer: >60 mL/min (ref >60)
GFR calc non Af Amer: >60 mL/min (ref >60)
GLUCOSE: 93 mg/dL (ref 65–99)
Potassium: 3.7 mmol/L (ref 3.5–5.1)
SODIUM: 139 mmol/L (ref 135–145)
Total Bilirubin: 0.4 mg/dL (ref 0.3–1.2)
Total Protein: 7 g/dL (ref 6.5–8.1)

## 2017-05-13 LAB — CBC
HEMATOCRIT: 34.3 % — AB (ref 35.0–47.0)
Hemoglobin: 11.6 g/dL — ABNORMAL LOW (ref 12.0–16.0)
MCH: 30.1 pg (ref 26.0–34.0)
MCHC: 33.9 g/dL (ref 32.0–36.0)
MCV: 88.9 fL (ref 80.0–100.0)
PLATELETS: 381 10*3/uL (ref 150–440)
RBC: 3.86 MIL/uL (ref 3.80–5.20)
RDW: 14.7 % — ABNORMAL HIGH (ref 11.5–14.5)
WBC: 11 10*3/uL (ref 3.6–11.0)

## 2017-05-13 LAB — TROPONIN I: Troponin I: <0.03 ng/mL (ref <0.03)

## 2017-05-13 MED ORDER — FUROSEMIDE 40 MG PO TABS: 40.0000 mg | ORAL_TABLET | Freq: Every day | ORAL | 0 refills | Status: AC

## 2017-05-13 NOTE — ED Notes (Signed)
Sleeping with family at bedside, resp unlabored. Await results.

## 2017-05-13 NOTE — ED Provider Notes (Signed)
[facility] Emergency Department Provider Note    First MD Initiated Contact with Patient 05/13/17 1327     (approximate)  I have reviewed the triage vital signs and the nursing notes.   HISTORY  Chief Complaint Leg Swelling    HPI Kelsey Hopkins is a 32 y.o. female the ER chief complaint of recurrent bilateral lower extremity edema causing crampy aching in her ankles.  Denies any chest pain or shortness of breath.  No fevers.  Has never had pain like this before.  No previous blood clots.  Was seen in the ER in the last month for similar symptoms but it never went away and is having persistent pain.  Family history of congestive heart failure but none noted personally.   Past Medical History:  Diagnosis Date  . Medical history non-contributory    No family history on file. Past Surgical History:  Procedure Laterality Date  . CHROMOPERTUBATION N/A 02/26/2015   Procedure: CHROMOPERTUBATION;  Surgeon: Malachy Mood, MD;  Location: [facility] ORS;  Service: Gynecology;  Laterality: N/A;  . LAPAROSCOPIC UNILATERAL SALPINGECTOMY Right 02/26/2015   Procedure: LAPAROSCOPIC UNILATERAL SALPINGECTOMY;  Surgeon: Malachy Mood, MD;  Location: [facility] ORS;  Service: Gynecology;  Laterality: Right;  . NO PAST SURGERIES     There are no active problems to display for this patient.     Prior to Admission medications   Not on File    Allergies Patient has no known allergies.    Social History Social History   Tobacco Use  . Smoking status: Current Every Day Smoker    Packs/day: 0.50    Types: Cigarettes  . Smokeless tobacco: Never Used  Substance Use Topics  . Alcohol use: Yes    Comment: socially  . Drug use: Yes    Types: Marijuana    Comment: every day    Review of Systems Patient denies headaches, rhinorrhea, blurry vision, numbness, shortness of breath, chest pain, edema, cough, abdominal pain, nausea, vomiting, diarrhea, dysuria, fevers, rashes  or hallucinations unless otherwise stated above in HPI. ____________________________________________   PHYSICAL EXAM:  VITAL SIGNS: Vitals:   05/13/17 1210  BP: 127/82  Pulse: 87  Resp: 18  Temp: 98.7 F (37.1 C)  SpO2: 100%    Constitutional: Alert and oriented. Well appearing and in no acute distress. Eyes: Conjunctivae are normal.  Head: Atraumatic. Nose: No congestion/rhinnorhea. Mouth/Throat: Mucous membranes are moist.   Neck: No stridor. Painless ROM.  Cardiovascular: Normal rate, regular rhythm. Grossly normal heart sounds.  Good peripheral circulation. Respiratory: Normal respiratory effort.  No retractions. Lungs CTAB. Gastrointestinal: Soft and nontender. No distention. No abdominal bruits. No CVA tenderness. Genitourinary:  Musculoskeletal: No lower extremity tenderness, 2+ BLE edema.  No joint effusions. Neurologic:  Normal speech and language. No gross focal neurologic deficits are appreciated. No facial droop Skin:  Skin is warm, dry and intact. No rash noted. Psychiatric: Mood and affect are normal. Speech and behavior are normal.  ____________________________________________   LABS (all labs ordered are listed, but only abnormal results are displayed)  Results for orders placed or performed during the hospital encounter of 05/13/17 (from the past 24 hour(s))  CBC     Status: Abnormal   Collection Time: 05/13/17 12:41 PM  Result Value Ref Range   WBC 11.0 3.6 - 11.0 K/uL   RBC 3.86 3.80 - 5.20 MIL/uL   Hemoglobin 11.6 (L) 12.0 - 16.0 g/dL   HCT 34.3 (L) 35.0 - 47.0 %   MCV 88.9  80.0 - 100.0 fL   MCH 30.1 26.0 - 34.0 pg   MCHC 33.9 32.0 - 36.0 g/dL   RDW 14.7 (H) 11.5 - 14.5 %   Platelets 381 150 - 440 K/uL  Comprehensive metabolic panel     Status: Abnormal   Collection Time: 05/13/17 12:41 PM  Result Value Ref Range   Sodium 139 135 - 145 mmol/L   Potassium 3.7 3.5 - 5.1 mmol/L   Chloride 107 101 - 111 mmol/L   CO2 28 22 - 32 mmol/L   Glucose,  Bld 93 65 - 99 mg/dL   BUN 8 6 - 20 mg/dL   Creatinine, Ser 0.72 0.44 - 1.00 mg/dL   Calcium 8.7 (L) 8.9 - 10.3 mg/dL   Total Protein 7.0 6.5 - 8.1 g/dL   Albumin 3.8 3.5 - 5.0 g/dL   AST 17 15 - 41 U/L   ALT 11 (L) 14 - 54 U/L   Alkaline Phosphatase 72 38 - 126 U/L   Total Bilirubin 0.4 0.3 - 1.2 mg/dL   GFR calc non Af Amer >60 >60 mL/min   GFR calc Af Amer >60 >60 mL/min   Anion gap 4 (L) 5 - 15   ____________________________________________  EKG My review and personal interpretation at Time: 13:44   Indication: edema  Rate: 70  Rhythm: sinus Axis: normal Other: normal intervals, no stemi ____________________________________________  RADIOLOGY  I personally reviewed all radiographic images ordered to evaluate for the above acute complaints and reviewed radiology reports and findings.  These findings were personally discussed with the patient.  Please see medical record for radiology report.  ____________________________________________   PROCEDURES  Procedure(s) performed:  Procedures    Critical Care performed: no ____________________________________________   INITIAL IMPRESSION / ASSESSMENT AND PLAN / ED COURSE  Pertinent labs & imaging results that were available during my care of the patient were reviewed by me and considered in my medical decision making (see chart for details).  DDX: edema, dvt, chf, lymphedema  Kelsey Hopkins is a 32 y.o. who presents to the ED with symptoms as described above.  No respiratory distress no hypoxia.  EKG showed no evidence of acute ischemia.  Will order her lower extremity duplex to evaluate for DVT.  No evidence of infectious process at this time.  Less likely congestive heart failure but will evaluate if she does have significant family history.  Clinical Course as of May 13 1517  Sat May 13, 2017  1513 Patient reassessed.  Bedside ultrasound does not show any evidence of pericardial effusion.  Does have good  contractility.  At this point do believe patient stable and appropriate for outpatient follow-up.  Will give short course of diuretic to help with edema.  Discussed signs and symptoms which the patient should return to the Er.  Have discussed with the patient and available family all diagnostics and treatments performed thus far and all questions were answered to the best of my ability. The patient demonstrates understanding and agreement with plan.    [PR]    Clinical Course User Index [PR] Merlyn Lot, MD     As part of my medical decision making, I reviewed the following data within the [place] notes reviewed and incorporated, Labs reviewed, notes from prior ED visits and [place] Controlled Substance Database   ____________________________________________   FINAL CLINICAL IMPRESSION(S) / ED DIAGNOSES  Final diagnoses:  Peripheral edema      NEW MEDICATIONS STARTED DURING THIS VISIT:  New  Prescriptions   No medications on file     Note:  This document was prepared using Dragon voice recognition software and may include unintentional dictation errors.    Merlyn Lot, MD 05/13/17 1520

## 2017-05-13 NOTE — ED Triage Notes (Signed)
Pt reports swelling to her bilateral legs, feet and ankles for the past few weeks. Pt reports has been seen here before for the same but unsure of what is causing it.

## 2017-07-11 ENCOUNTER — Ambulatory Visit: Payer: Self-pay | Admitting: Cardiovascular Disease

## 2017-07-11 ENCOUNTER — Encounter

## 2017-12-25 ENCOUNTER — Encounter: Payer: Self-pay | Admitting: Intensive Care

## 2017-12-25 ENCOUNTER — Other Ambulatory Visit: Payer: Self-pay

## 2017-12-25 ENCOUNTER — Emergency Department
Admission: EM | Admit: 2017-12-25 | Discharge: 2017-12-25 | Disposition: A | Discharge status: Home / Self Care | Payer: Self-pay | Attending: Emergency Medicine | Admitting: Emergency Medicine

## 2017-12-25 DIAGNOSIS — F1210 Cannabis abuse, uncomplicated: Secondary | ICD-10-CM | POA: Insufficient documentation

## 2017-12-25 DIAGNOSIS — Z79899 Other long term (current) drug therapy: Secondary | ICD-10-CM | POA: Insufficient documentation

## 2017-12-25 DIAGNOSIS — R197 Diarrhea, unspecified: Secondary | ICD-10-CM | POA: Insufficient documentation

## 2017-12-25 DIAGNOSIS — R0981 Nasal congestion: Secondary | ICD-10-CM | POA: Insufficient documentation

## 2017-12-25 DIAGNOSIS — R05 Cough: Secondary | ICD-10-CM | POA: Insufficient documentation

## 2017-12-25 DIAGNOSIS — R109 Unspecified abdominal pain: Secondary | ICD-10-CM | POA: Insufficient documentation

## 2017-12-25 DIAGNOSIS — F17210 Nicotine dependence, cigarettes, uncomplicated: Secondary | ICD-10-CM | POA: Insufficient documentation

## 2017-12-25 MED ORDER — DIPHENOXYLATE-ATROPINE 2.5-0.025 MG PO TABS
2.0000 | ORAL_TABLET | Freq: Once | ORAL | Status: AC
  Administered 2017-12-25: 2 via ORAL
  Filled 2017-12-25: qty 2

## 2017-12-25 NOTE — ED Provider Notes (Signed)
[facility] Emergency Department Provider Note   ____________________________________________   First MD Initiated Contact with Patient 12/25/17 1335     (approximate)  I have reviewed the triage vital signs and the nursing notes.   HISTORY  Chief Complaint Diarrhea    HPI Kelsey Hopkins is a 32 y.o. female  patient presents with diarrhea which started this morning.  Patient denies bloody stools.  Patient also has URI signs and symptoms consisting of nasal congestion postnasal drainage and coughing.  Patient states abdominal cramping/pain.  Patient denies nausea or vomiting.  Patient rates pain as 8/10.  No palliative measure for complaint.   Past Medical History:  Diagnosis Date  . Medical history non-contributory     There are no active problems to display for this patient.   Past Surgical History:  Procedure Laterality Date  . CHROMOPERTUBATION N/A 02/26/2015   Procedure: CHROMOPERTUBATION;  Surgeon: Malachy Mood, MD;  Location: [facility] ORS;  Service: Gynecology;  Laterality: N/A;  . LAPAROSCOPIC UNILATERAL SALPINGECTOMY Right 02/26/2015   Procedure: LAPAROSCOPIC UNILATERAL SALPINGECTOMY;  Surgeon: Malachy Mood, MD;  Location: [facility] ORS;  Service: Gynecology;  Laterality: Right;  . NO PAST SURGERIES      Prior to Admission medications   Medication Sig Start Date End Date Taking? Authorizing Provider  furosemide (LASIX) 40 MG tablet Take 1 tablet (40 mg total) by mouth daily. 05/13/17 05/13/18  Merlyn Lot, MD    Allergies Patient has no known allergies.  History reviewed. No pertinent family history.  Social History Social History   Tobacco Use  . Smoking status: Current Every Day Smoker    Packs/day: 0.50    Types: Cigarettes  . Smokeless tobacco: Never Used  Substance Use Topics  . Alcohol use: Yes    Comment: socially  . Drug use: Yes    Types: Marijuana    Comment: every day    Review of Systems Constitutional:  No fever/chills Eyes: No visual changes. ENT: No sore throat. Cardiovascular: Denies chest pain. Respiratory: Denies shortness of breath. Gastrointestinal: No abdominal pain.  No nausea, no vomiting.  Diarrhea.  No constipation. Genitourinary: Negative for dysuria. Musculoskeletal: Negative for back pain. Skin: Negative for rash. Neurological: Negative for headaches, focal weakness or numbness.   ____________________________________________   PHYSICAL EXAM:  VITAL SIGNS: ED Triage Vitals  Enc Vitals Group     BP 12/25/17 1244 (!) 136/99     Pulse Rate 12/25/17 1244 86     Resp 12/25/17 1244 16     Temp 12/25/17 1244 97.6 F (36.4 C)     Temp Source 12/25/17 1244 Oral     SpO2 12/25/17 1244 99 %     Weight 12/25/17 1246 260 lb (117.9 kg)     Height 12/25/17 1246 5\' 11"  (1.803 m)     Head Circumference --      Peak Flow --      Pain Score 12/25/17 1245 8     Pain Loc --      Pain Edu? --      Excl. in [place]? --     Constitutional: Alert and oriented. Well appearing and in no acute distress. Nose: Edematous nasal turbinates clear rhinorrhea.   Mouth/Throat: Mucous membranes are moist.  Oropharynx non-erythematous. Neck: No stridor.  Cardiovascular: Normal rate, regular rhythm. Grossly normal heart sounds.  Good peripheral circulation. Respiratory: Normal respiratory effort.  No retractions. Lungs CTAB. Gastrointestinal: Soft and nontender. No distention. No abdominal bruits. No CVA tenderness. Musculoskeletal: No lower  extremity tenderness nor edema.  No joint effusions. Neurologic:  Normal speech and language. No gross focal neurologic deficits are appreciated. No gait instability. Skin:  Skin is warm, dry and intact. No rash noted. Psychiatric: Mood and affect are normal. Speech and behavior are normal.  ____________________________________________   LABS (all labs ordered are listed, but only abnormal results are displayed)  Labs Reviewed - No data to  display ____________________________________________  EKG   ____________________________________________  RADIOLOGY  ED MD interpretation:    Official radiology report(s): No results found.  ____________________________________________   PROCEDURES  Procedure(s) performed: None  Procedures  Critical Care performed: No  ____________________________________________   INITIAL IMPRESSION / ASSESSMENT AND PLAN / ED COURSE  As part of my medical decision making, I reviewed the following data within the electronic MEDICAL RECORD NUMBER    Diarrhea suggestive of viral etiology.  Patient given discharge care instructions and a work note.  Patient advised [facility]-up [facility].      ____________________________________________   FINAL CLINICAL IMPRESSION(S) / ED DIAGNOSES  Final diagnoses:  Diarrhea, unspecified type     ED Discharge Orders    None       Note:  This document was prepared using Dragon voice recognition software and may include unintentional dictation errors.    Sable Feil, PA-C 12/25/17 [place], Randall An, MD 12/25/17 [phone]

## 2017-12-25 NOTE — ED Notes (Signed)
See triage note  Presents with diarrhea   States she has had about 3 episodes of diarrhea for the past 3 days   Denies any fever or n/v    States she needs a note to go back to work

## 2017-12-25 NOTE — ED Triage Notes (Signed)
Patient reports having slight diarrhea and her work needs a note to return to work

## 2018-06-04 DIAGNOSIS — R7303 Prediabetes: Secondary | ICD-10-CM | POA: Insufficient documentation

## 2018-06-28 DIAGNOSIS — Z6837 Body mass index (BMI) 37.0-37.9, adult: Secondary | ICD-10-CM | POA: Insufficient documentation

## 2018-06-28 DIAGNOSIS — Z720 Tobacco use: Secondary | ICD-10-CM | POA: Insufficient documentation

## 2018-10-05 DIAGNOSIS — N939 Abnormal uterine and vaginal bleeding, unspecified: Secondary | ICD-10-CM | POA: Insufficient documentation

## 2018-11-12 DIAGNOSIS — R6889 Other general symptoms and signs: Secondary | ICD-10-CM | POA: Insufficient documentation

## 2019-02-18 ENCOUNTER — Other Ambulatory Visit: Payer: Self-pay

## 2019-05-16 ENCOUNTER — Other Ambulatory Visit: Payer: Self-pay

## 2019-05-16 ENCOUNTER — Ambulatory Visit: Payer: Self-pay | Admitting: Family Medicine

## 2019-05-16 ENCOUNTER — Encounter: Payer: Self-pay | Admitting: Family Medicine

## 2019-05-16 DIAGNOSIS — Z8619 Personal history of other infectious and parasitic diseases: Secondary | ICD-10-CM

## 2019-05-16 DIAGNOSIS — Z113 Encounter for screening for infections with a predominantly sexual mode of transmission: Secondary | ICD-10-CM

## 2019-05-16 MED ORDER — THERA VITAL M PO TABS: 1.0000 | ORAL_TABLET | Freq: Every day | ORAL | 0 refills | Status: AC

## 2019-05-16 NOTE — Progress Notes (Signed)
[facility] Department STI clinic/screening visit  Subjective:  Kelsey Hopkins is a 34 y.o. female being seen today for  Chief Complaint  Patient presents with  . SEXUALLY TRANSMITTED DISEASE    STD screening including bloodwork     The patient reports they do not have symptoms. Patient reports that they do desire a pregnancy in the next year. They reported they are not interested in discussing contraception today.   Patient has the following medical conditions:   Patient Active Problem List   Diagnosis Date Noted  . History of syphilis 05/16/2019  . Cold intolerance 11/12/2018  . Abnormal uterine bleeding 10/05/2018  . Class 2 obesity with body mass index (BMI) of 37.0 to 37.9 in adult 06/28/2018  . Tobacco use 06/28/2018  . Prediabetes 06/04/2018    HPI  Pt reports she is here for STI screening. Denies symptoms.   See flowsheet for further details and programmatic requirements.    No LMP recorded. Last sex: 6 days ago BCM: none Desires EC? N/a    No components found for: HCV  The following portions of the patient's history were reviewed and updated as appropriate: allergies, current medications, past medical history, past social history, past surgical history and problem list.  Objective:  There were no vitals filed for this visit.   Physical Exam Vitals and nursing note reviewed.  Constitutional:      Appearance: Normal appearance.  HENT:     Head: Normocephalic and atraumatic.     Mouth/Throat:     Mouth: Mucous membranes are moist.     Pharynx: Oropharynx is clear. No oropharyngeal exudate or posterior oropharyngeal erythema.  Pulmonary:     Effort: Pulmonary effort is normal.  Abdominal:     General: Abdomen is flat.     Palpations: There is no mass.     Tenderness: There is no abdominal tenderness. There is no rebound.  Genitourinary:    General: Normal vulva.     Exam position: Lithotomy position.     Pubic Area: No rash or pubic  lice.      Labia:        Right: No rash or lesion.        Left: No rash or lesion.      Vagina: Bleeding present. No vaginal discharge, erythema or lesions.     Cervix: Cervical bleeding present. No cervical motion tenderness, discharge, friability, lesion or erythema.     Uterus: Normal.      Adnexa: Right adnexa normal and left adnexa normal.     Rectum: Normal.  Lymphadenopathy:     Head:     Right side of head: No preauricular or posterior auricular adenopathy.     Left side of head: No preauricular or posterior auricular adenopathy.     Cervical: No cervical adenopathy.     Upper Body:     Right upper body: No supraclavicular or axillary adenopathy.     Left upper body: No supraclavicular or axillary adenopathy.     Lower Body: No right inguinal adenopathy. No left inguinal adenopathy.  Skin:    General: Skin is warm and dry.     Findings: No rash.  Neurological:     Mental Status: She is alert and oriented to person, place, and time.      Assessment and Plan:  [name] is a 34 y.o. female presenting to the [facility] Department for STI screening   1. Screening examination for venereal disease -Pt without  symptoms. Screenings today as below. Treat wet prep per standing order. -Patient does not meet criteria for HepB, HepC Screening.  -Counseled on warning s/sx and when to seek care. Recommended condom use with all sex and discussed importance of condom use for STI prevention. - WET PREP FOR [place], YEAST, [place] [facility] - Syphilis Serology, [place] [facility] - Gonococcus culture - throat  2. History of syphilis -Pt has hx of syphilis, treated ~2015 per pt. Asymptomatic and no known recent exposures. Getting screening titer today.   Pt requests multivitamins - given today.   Return if symptoms worsen or fail to improve.  No future appointments.  Kandee Keen, PA-C

## 2019-05-16 NOTE — Progress Notes (Signed)
Wet mount reviewed, no treatment indicated. MVI and planning a healthy pregnancy booklet given.Jenetta Downer, RN

## 2019-05-17 LAB — WET PREP FOR TRICH, YEAST, CLUE
Trichomonas Exam: NEGATIVE
Yeast Exam: NEGATIVE

## 2019-05-19 ENCOUNTER — Emergency Department: Payer: Self-pay

## 2019-05-19 ENCOUNTER — Other Ambulatory Visit: Payer: Self-pay

## 2019-05-19 DIAGNOSIS — M436 Torticollis: Secondary | ICD-10-CM | POA: Insufficient documentation

## 2019-05-19 DIAGNOSIS — Z79899 Other long term (current) drug therapy: Secondary | ICD-10-CM | POA: Insufficient documentation

## 2019-05-19 DIAGNOSIS — F17210 Nicotine dependence, cigarettes, uncomplicated: Secondary | ICD-10-CM | POA: Insufficient documentation

## 2019-05-19 NOTE — ED Triage Notes (Signed)
Patient c/o neck pain beginning Friday. Patient unable to turn head to side upon awakening. Patient reports unable to lift heavy objects with left arm, and that pain radiates down arm. Patient c/o swelling to neck/shoulder. Patient tender to palpation of the area.

## 2019-05-20 ENCOUNTER — Emergency Department
Admission: EM | Admit: 2019-05-20 | Discharge: 2019-05-20 | Disposition: A | Discharge status: Home / Self Care | Payer: Self-pay | Attending: Emergency Medicine | Admitting: Emergency Medicine

## 2019-05-20 DIAGNOSIS — R52 Pain, unspecified: Secondary | ICD-10-CM

## 2019-05-20 DIAGNOSIS — M436 Torticollis: Secondary | ICD-10-CM

## 2019-05-20 MED ORDER — KETOROLAC TROMETHAMINE 10 MG PO TABS
10.0000 mg | ORAL_TABLET | Freq: Once | ORAL | Status: AC
  Administered 2019-05-20: 10 mg via ORAL
  Filled 2019-05-20: qty 1

## 2019-05-20 MED ORDER — CYCLOBENZAPRINE HCL 10 MG PO TABS: 10.0000 mg | ORAL_TABLET | Freq: Three times a day (TID) | ORAL | 0 refills | Status: AC | PRN

## 2019-05-20 MED ORDER — CYCLOBENZAPRINE HCL 10 MG PO TABS
10.0000 mg | ORAL_TABLET | Freq: Once | ORAL | Status: AC
  Administered 2019-05-20: 10 mg via ORAL
  Filled 2019-05-20: qty 1

## 2019-05-20 MED ORDER — LIDOCAINE 5 % EX PTCH
2.0000 | MEDICATED_PATCH | CUTANEOUS | Status: DC
  Administered 2019-05-20: 2 via TRANSDERMAL
  Filled 2019-05-20: qty 2

## 2019-05-20 NOTE — ED Notes (Signed)
Pt reports left-sided neck and shoulder pain since last Friday. No known injury or trauma. Pt denies loss of sensation, numbness, or tingling sensation in affected extremity. Pt denies recent illness; no CP or SHOB; no N/V/D or fever.

## 2019-05-20 NOTE — ED Provider Notes (Signed)
[facility] Emergency Department Provider Note  ____________________________________________   First MD Initiated Contact with Patient 05/20/19 [phone]     (approximate)  I have reviewed the triage vital signs and the nursing notes.   HISTORY  Chief Complaint Torticollis and Shoulder Pain   HPI Kelsey Hopkins is a 34 y.o. female with below list of previous medical conditions presents to the emergency department secondary to 9 out of 10 left-sided neck pain which patient states she awoke with on Friday morning.  Patient states that the pain is worse with any movement of the neck.  Also states that the pain radiates down her left arm.  Patient denies any headache.  No weakness or numbness in the left arm.          Past Medical History:  Diagnosis Date  . Medical history non-contributory     Patient Active Problem List   Diagnosis Date Noted  . History of syphilis 05/16/2019  . Cold intolerance 11/12/2018  . Abnormal uterine bleeding 10/05/2018  . Class 2 obesity with body mass index (BMI) of 37.0 to 37.9 in adult 06/28/2018  . Tobacco use 06/28/2018  . Prediabetes 06/04/2018    Past Surgical History:  Procedure Laterality Date  . CHROMOPERTUBATION N/A 02/26/2015   Procedure: CHROMOPERTUBATION;  Surgeon: Malachy Mood, MD;  Location: [facility] ORS;  Service: Gynecology;  Laterality: N/A;  . LAPAROSCOPIC UNILATERAL SALPINGECTOMY Right 02/26/2015   Procedure: LAPAROSCOPIC UNILATERAL SALPINGECTOMY;  Surgeon: Malachy Mood, MD;  Location: [facility] ORS;  Service: Gynecology;  Laterality: Right;  . NO PAST SURGERIES      Prior to Admission medications   Medication Sig Start Date End Date Taking? Authorizing Provider  furosemide (LASIX) 40 MG tablet Take 1 tablet (40 mg total) by mouth daily. 05/13/17 05/13/18  Merlyn Lot, MD  Multiple Vitamins-Minerals (MULTIVITAMIN) tablet Take 1 tablet by mouth daily. 05/16/19   Caren Macadam, MD     Allergies Patient has no known allergies.  Family History  Problem Relation Age of Onset  . Cervical cancer Mother   . Diabetes Father   . Colon cancer Father   . Migraines Sister   . Leukemia Maternal Grandmother   . Hypertension Maternal Grandfather   . Diabetes Paternal Grandmother   . Alzheimer's disease Paternal Grandmother     Social History Social History   Tobacco Use  . Smoking status: Current Every Day Smoker    Packs/day: 0.50    Types: Cigarettes  . Smokeless tobacco: Never Used  Substance Use Topics  . Alcohol use: Not Currently    Comment: socially  . Drug use: Yes    Frequency: 35.0 times per week    Types: Marijuana    Comment: every day    Review of Systems Constitutional: No fever/chills Eyes: No visual changes. ENT: No sore throat. Cardiovascular: Denies chest pain. Respiratory: Denies shortness of breath. Gastrointestinal: No abdominal pain.  No nausea, no vomiting.  No diarrhea.  No constipation. Genitourinary: Negative for dysuria. Musculoskeletal: Positive for neck pain.  Negative for back pain. Integumentary: Negative for rash. Neurological: Negative for headaches, focal weakness or numbness.   ____________________________________________   PHYSICAL EXAM:  VITAL SIGNS: ED Triage Vitals [05/19/19 2316]  Enc Vitals Group     BP 127/88     Pulse Rate 98     Resp 18     Temp 98.3 F (36.8 C)     Temp src      SpO2 99 %  Weight 117.9 kg (260 lb)     Height 1.803 m (5\' 11" )     Head Circumference      Peak Flow      Pain Score 9     Pain Loc      Pain Edu?      Excl. in [place]?     Constitutional: Alert and oriented.  Eyes: Conjunctivae are normal.  Head: Atraumatic. Mouth/Throat: Patient is wearing a mask. Neck: No stridor.  Pain to palpation of the left trapezius muscle as well as of the left rhomboids Cardiovascular: Normal rate, regular rhythm. Good peripheral circulation. Grossly normal heart sounds. Respiratory:  Normal respiratory effort.  No retractions. Musculoskeletal: Pain to palpation of the left trapezius muscle and rhomboids. Neurologic:  Normal speech and language. No gross focal neurologic deficits are appreciated.  Skin:  Skin is warm, dry and intact. Psychiatric: Mood and affect are normal. Speech and behavior are normal.  ______  RADIOLOGY I, Remy N Graylon Amory, personally viewed and evaluated these images (plain radiographs) as part of my medical decision making, as well as reviewing the written report by the radiologist.  ED MD interpretation: Negative left shoulder x-ray per radiology  Official radiology report(s): DG Shoulder Left  Result Date: 05/19/2019 CLINICAL DATA:  Shoulder pain EXAM: LEFT SHOULDER - 2+ VIEW COMPARISON:  None. FINDINGS: There is no evidence of fracture or dislocation. There is no evidence of arthropathy or other focal bone abnormality. Soft tissues are unremarkable. IMPRESSION: Negative. Electronically Signed   By: Donavan Foil M.D.   On: 05/19/2019 23:46      Procedures   ____________________________________________   INITIAL IMPRESSION / MDM / ASSESSMENT AND PLAN / ED COURSE  As part of my medical decision making, I reviewed the following data within the electronic MEDICAL RECORD NUMBER  34 year old female presented with above-stated history and physical exam consistent with torticollis.  Patient given Flexeril 10 mg tablets and Lidoderm patches applied in the emergency department.  Will be prescribed Flexeril for home.  ____________________________________________  FINAL CLINICAL IMPRESSION(S) / ED DIAGNOSES  Final diagnoses:  Pain  Torticollis, acute     MEDICATIONS GIVEN DURING THIS VISIT:  Medications  cyclobenzaprine (FLEXERIL) tablet 10 mg (has no administration in time range)  lidocaine (LIDODERM) 5 % 2 patch (has no administration in time range)  ketorolac (TORADOL) tablet 10 mg (has no administration in time range)     ED  Discharge Orders    None      *Please note:  Kelsey Hopkins was evaluated in Emergency Department on 05/20/2019 for the symptoms described in the history of present illness. She was evaluated in the context of the global COVID-19 pandemic, which necessitated consideration that the patient might be at risk for infection with the SARS-CoV-2 virus that causes COVID-19. Institutional protocols and algorithms that pertain to the evaluation of patients at risk for COVID-19 are in a state of rapid change based on information released by regulatory bodies including the CDC and federal and state organizations. These policies and algorithms were followed during the patient's care in the ED.  Some ED evaluations and interventions may be delayed as a result of limited staffing during the pandemic.*  Note:  This document was prepared using Dragon voice recognition software and may include unintentional dictation errors.   Gregor Hams, MD 05/20/19 [phone]

## 2019-05-21 LAB — GONOCOCCUS CULTURE

## 2019-09-11 ENCOUNTER — Ambulatory Visit: Payer: Self-pay

## 2019-11-06 ENCOUNTER — Ambulatory Visit: Payer: Self-pay

## 2019-11-08 ENCOUNTER — Ambulatory Visit: Payer: Self-pay | Admitting: Advanced Practice Midwife

## 2019-11-08 ENCOUNTER — Other Ambulatory Visit: Payer: Self-pay

## 2019-11-08 ENCOUNTER — Encounter: Payer: Self-pay | Admitting: Advanced Practice Midwife

## 2019-11-08 ENCOUNTER — Ambulatory Visit: Payer: Self-pay

## 2019-11-08 DIAGNOSIS — B9689 Other specified bacterial agents as the cause of diseases classified elsewhere: Secondary | ICD-10-CM

## 2019-11-08 DIAGNOSIS — F1490 Cocaine use, unspecified, uncomplicated: Secondary | ICD-10-CM

## 2019-11-08 DIAGNOSIS — T7412XS Child physical abuse, confirmed, sequela: Secondary | ICD-10-CM

## 2019-11-08 DIAGNOSIS — Z113 Encounter for screening for infections with a predominantly sexual mode of transmission: Secondary | ICD-10-CM

## 2019-11-08 DIAGNOSIS — F1010 Alcohol abuse, uncomplicated: Secondary | ICD-10-CM | POA: Insufficient documentation

## 2019-11-08 DIAGNOSIS — F1210 Cannabis abuse, uncomplicated: Secondary | ICD-10-CM | POA: Insufficient documentation

## 2019-11-08 DIAGNOSIS — T7412XA Child physical abuse, confirmed, initial encounter: Secondary | ICD-10-CM | POA: Insufficient documentation

## 2019-11-08 DIAGNOSIS — N760 Acute vaginitis: Secondary | ICD-10-CM

## 2019-11-08 LAB — WET PREP FOR TRICH, YEAST, CLUE
Trichomonas Exam: NEGATIVE
Yeast Exam: NEGATIVE

## 2019-11-08 MED ORDER — METRONIDAZOLE 250 MG PO TABS: 250.0000 mg | ORAL_TABLET | Freq: Three times a day (TID) | ORAL | 0 refills | Status: AC

## 2019-11-08 NOTE — Progress Notes (Signed)
Wet mount reviewed with provider and pt treated for BV per standing orders and per Ola Spurr, CNM verbal order. Pt received Metronidazole 250mg  (#21), 1 tab po TID x 7 days per Ola Spurr, CNM verbal order. Pt received PCP and dental info per provider order. Provider orders completed.

## 2019-11-08 NOTE — Progress Notes (Signed)
[facility] Department STI clinic/screening visit  Subjective:  Kelsey Hopkins is a 34 y.o. SBF smoker G62P0 female being seen today for an STI screening visit. The patient reports they do have symptoms.  Patient reports that they do not desire a pregnancy in the next year.   They reported they are not interested in discussing contraception today.  Patient's last menstrual period was 10/23/2019.   Patient has the following medical conditions:   Patient Active Problem List   Diagnosis Date Noted  . Marijuana abuse daily 11/08/2019  . History of syphilis 05/16/2019  . Cold intolerance 11/12/2018  . Abnormal uterine bleeding 10/05/2018  . Class 2 obesity; 260 lbs 06/28/2018  . Tobacco use 1 1/2 ppd 06/28/2018  . Prediabetes 06/04/2018    No chief complaint on file.   HPI  Patient reports malodor x 1.5 wks.  LMP 10/23/19.  Last sex last night without condom; with current partner x 3 mo.  2 partners in last 3 mo.  Smoking 1.5 ppd. Last vaped 3 wks ago.  Last Black & Mild last week.    MJ daily.  Last cocaine 11/01/19.  Last ETOH 10/26/19 (5 shots Tequila +5 mixed drinks) "alcohol poisoning".  Last HIV test per patient/review of record was 05/16/19 Patient reports last pap was 03/24/14 neg HPV neg   See flowsheet for further details and programmatic requirements.    The following portions of the patient's history were reviewed and updated as appropriate: allergies, current medications, past medical history, past social history, past surgical history and problem list.  Objective:  There were no vitals filed for this visit.  Physical Exam Vitals and nursing note reviewed.  Constitutional:      Appearance: Normal appearance. She is obese.  HENT:     Head: Normocephalic and atraumatic.     Mouth/Throat:     Mouth: Mucous membranes are moist.     Pharynx: Oropharynx is clear. No oropharyngeal exudate or posterior oropharyngeal erythema.  Eyes:     Conjunctiva/sclera:  Conjunctivae normal.  Pulmonary:     Effort: Pulmonary effort is normal.  Abdominal:     Palpations: Abdomen is soft. There is no mass.     Tenderness: There is no abdominal tenderness. There is no rebound.     Comments: Soft without masses or tenderness, poor tone, increased adipose  Genitourinary:    General: Normal vulva.     Exam position: Lithotomy position.     Pubic Area: No rash or pubic lice.      Labia:        Right: No rash or lesion.        Left: No rash or lesion.      Vagina: Vaginal discharge (grey leukorrhea, ph>4.5) present. No erythema, bleeding or lesions.     Cervix: Normal.     Uterus: Normal.      Rectum: Normal.  Lymphadenopathy:     Head:     Right side of head: No preauricular or posterior auricular adenopathy.     Left side of head: No preauricular or posterior auricular adenopathy.     Cervical: No cervical adenopathy.     Upper Body:     Right upper body: No supraclavicular or axillary adenopathy.     Left upper body: No supraclavicular or axillary adenopathy.     Lower Body: No right inguinal adenopathy. No left inguinal adenopathy.  Skin:    General: Skin is warm and dry.     Findings: No rash.  Neurological:     Mental Status: She is alert and oriented to person, place, and time.      Assessment and Plan:  Kelsey Hopkins is a 34 y.o. female presenting to the [facility] Department for STI screening  1. Screening examination for venereal disease Treat wet mount per standing orders Immunization nurse consult Please give primary care MD list to pt Please give dental list to pt (maternity clinic has list for uninsured pts) - WET PREP FOR [place], YEAST, CLUE - Gonococcus culture - Syphilis Serology, [place] [facility] - HIV/HCV [place] [facility]  2. Marijuana abuse daily      No follow-ups on file.  No future appointments.  Herbie Saxon, CNM

## 2019-11-12 LAB — GONOCOCCUS CULTURE

## 2020-06-11 ENCOUNTER — Other Ambulatory Visit: Payer: Self-pay

## 2020-06-12 ENCOUNTER — Ambulatory Visit: Payer: Self-pay

## 2020-10-16 IMAGING — CR DG SHOULDER 2+V*L*
3 series · 3 of 3 positions shown · non-contrast
Comparison: None.

CLINICAL DATA: Shoulder pain

EXAM:
LEFT SHOULDER - 2+ VIEW

[shoulder grashey]
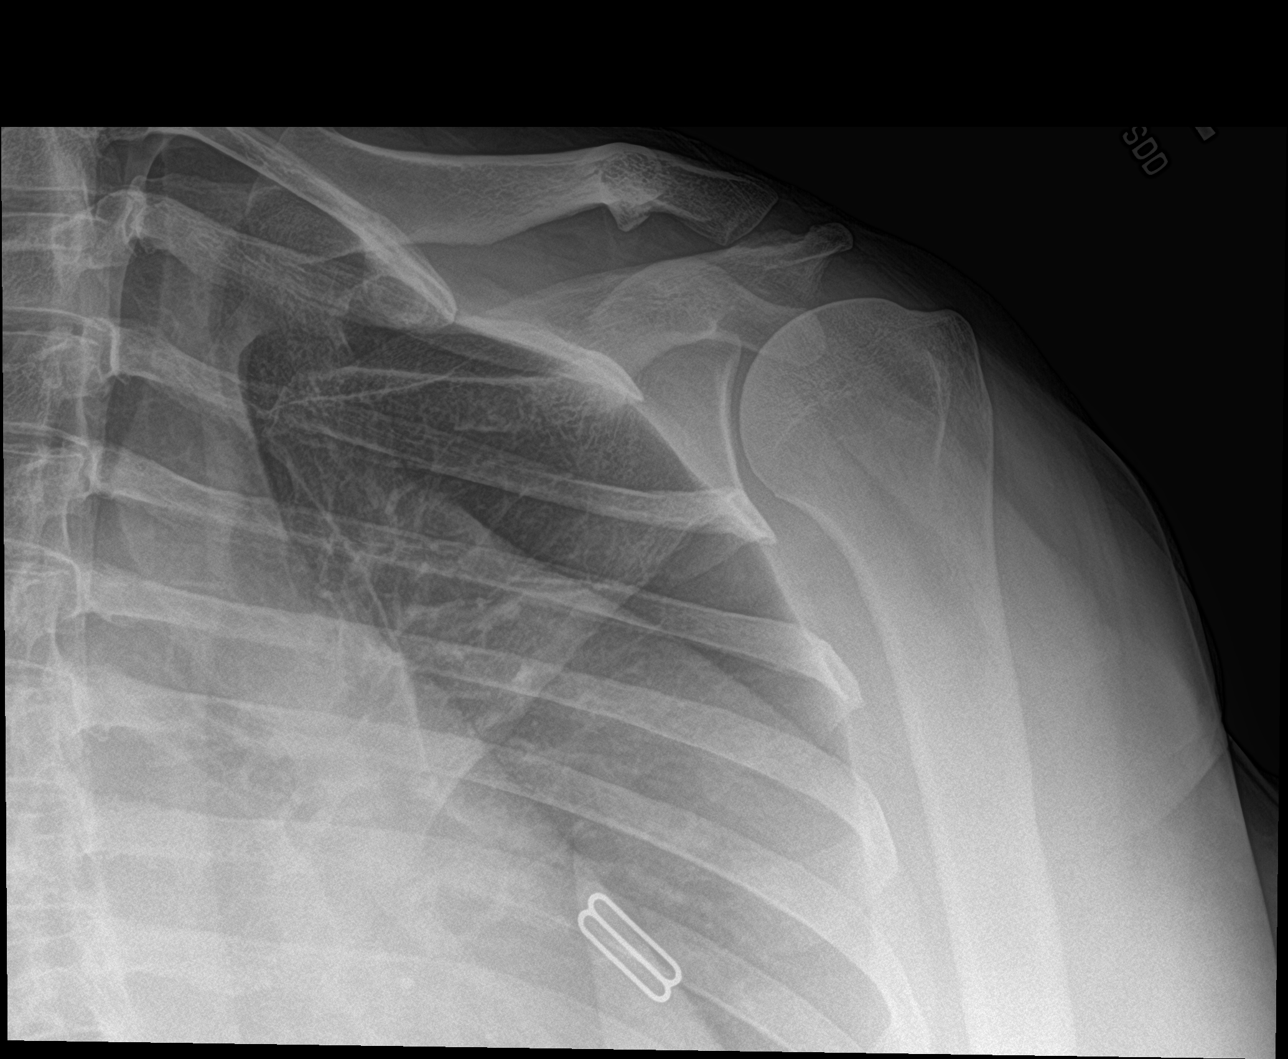

[shoulder y view]
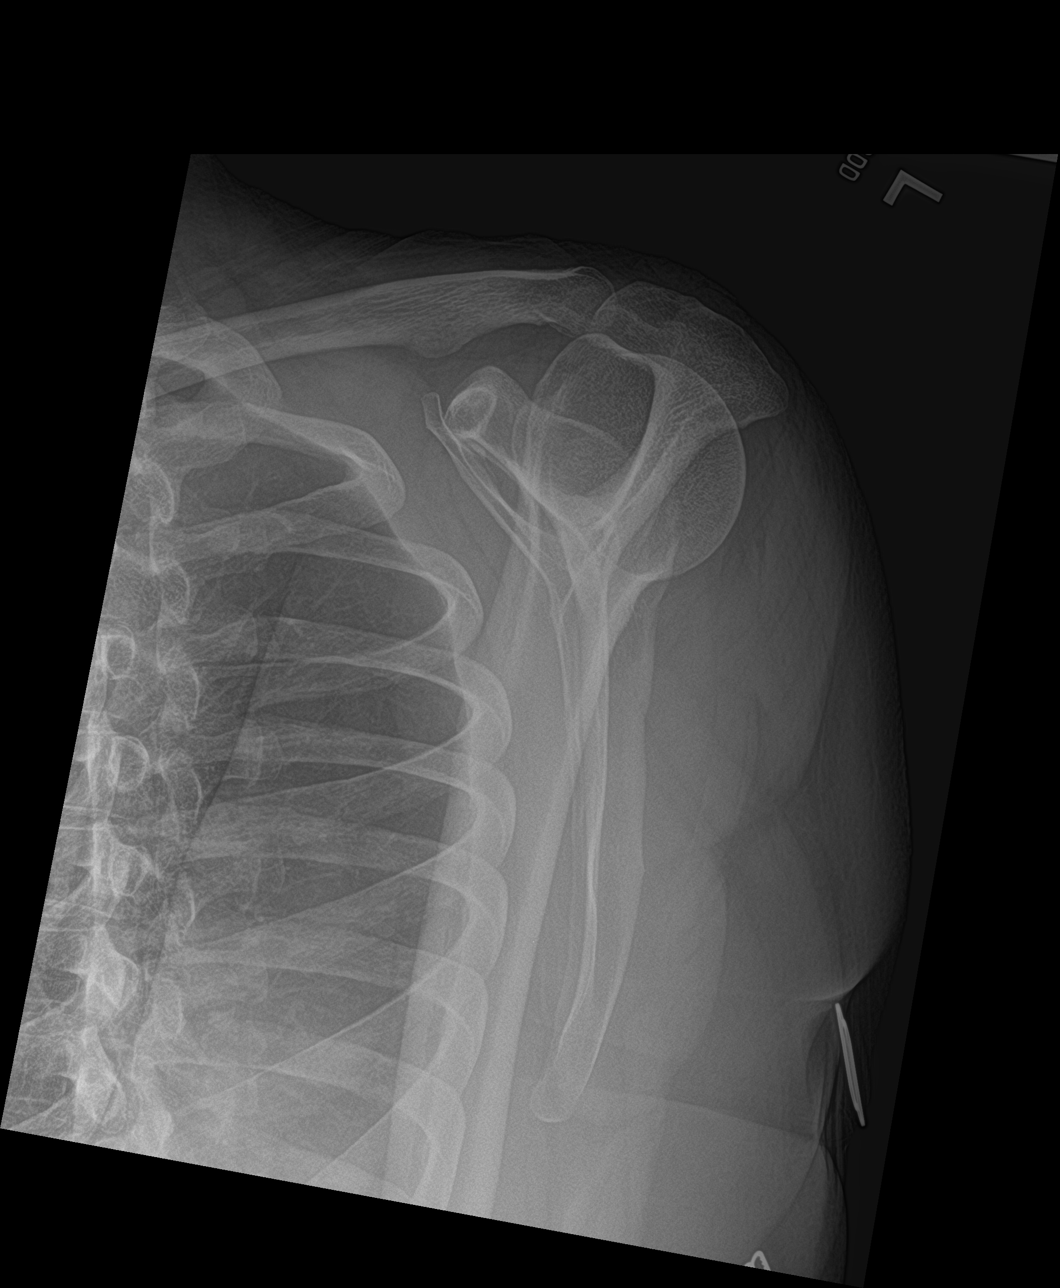

[shoulder axillary]
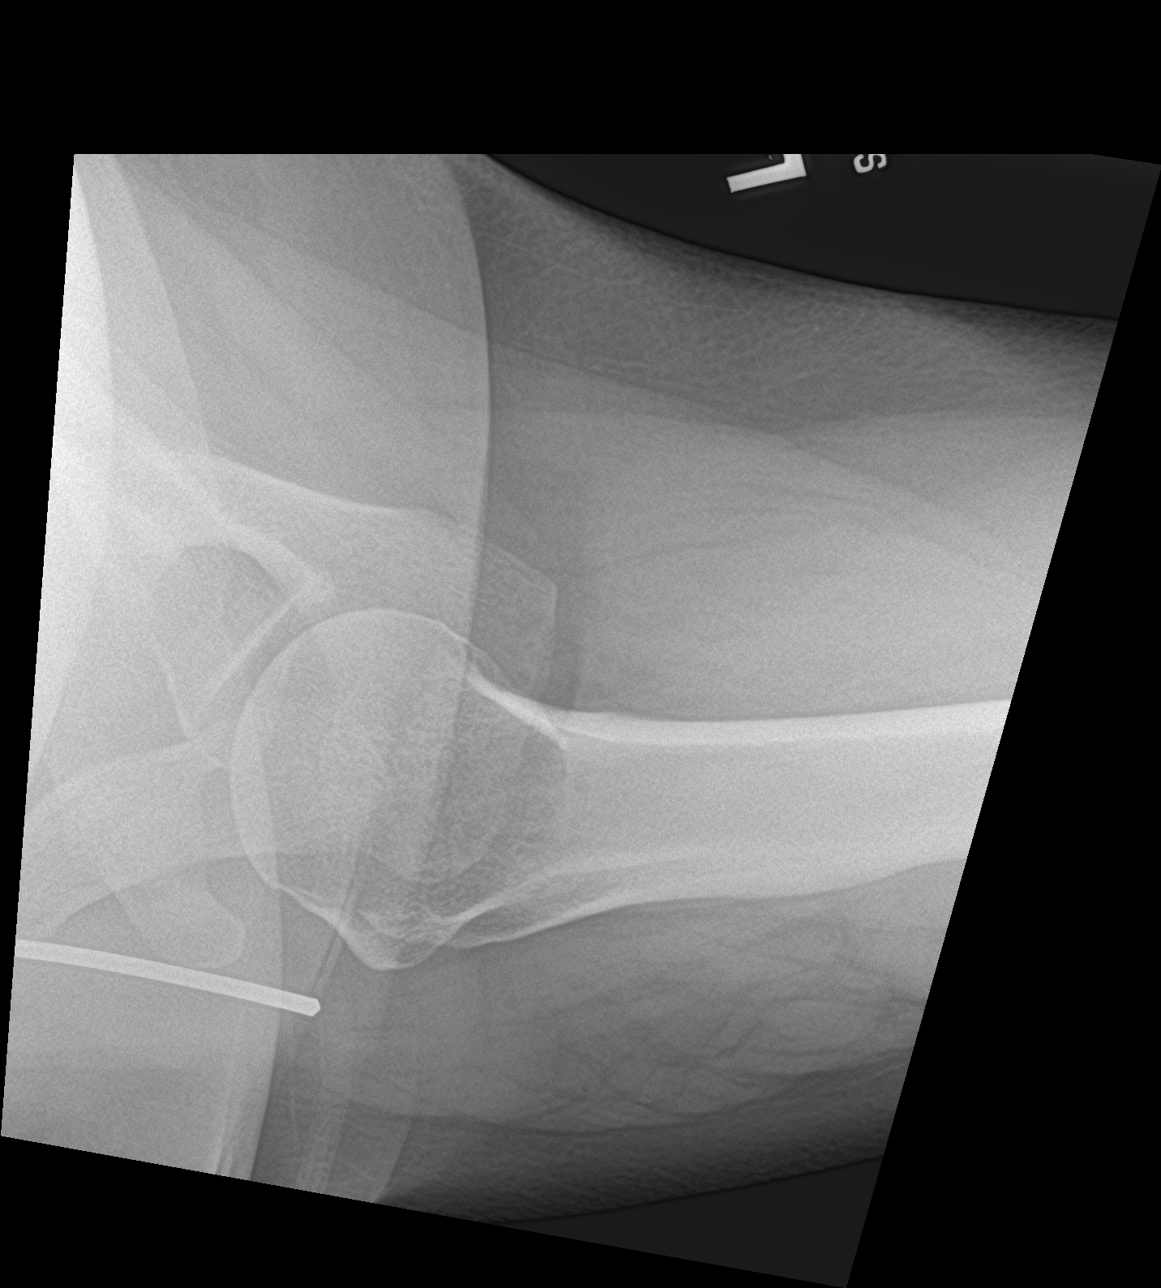

[3 of 3 positions shown; findings below may reference images not displayed]

FINDINGS: There is no evidence of fracture or dislocation. There is no
evidence of arthropathy or other focal bone abnormality. Soft
tissues are unremarkable.
IMPRESSION: Negative.

## 2021-06-07 ENCOUNTER — Other Ambulatory Visit: Payer: Self-pay

## 2021-06-07 ENCOUNTER — Emergency Department
Admission: EM | Admit: 2021-06-07 | Discharge: 2021-06-07 | Disposition: A | Discharge status: Home / Self Care | Payer: Self-pay | Attending: Student in an Organized Health Care Education/Training Program | Admitting: Student in an Organized Health Care Education/Training Program

## 2021-06-07 DIAGNOSIS — M797 Fibromyalgia: Secondary | ICD-10-CM | POA: Insufficient documentation

## 2021-06-07 MED ORDER — MELOXICAM 15 MG PO TABS: 15.0000 mg | ORAL_TABLET | Freq: Every day | ORAL | 5 refills | Status: AC

## 2021-06-07 MED ORDER — KETOROLAC TROMETHAMINE 30 MG/ML IJ SOLN
30.0000 mg | Freq: Once | INTRAMUSCULAR | Status: AC
  Administered 2021-06-07: 30 mg via INTRAMUSCULAR
  Filled 2021-06-07: qty 1

## 2021-06-07 MED ORDER — METHOCARBAMOL 500 MG PO TABS: 500.0000 mg | ORAL_TABLET | Freq: Four times a day (QID) | ORAL | 0 refills | Status: AC

## 2021-06-07 MED ORDER — ORPHENADRINE CITRATE 30 MG/ML IJ SOLN
60.0000 mg | Freq: Once | INTRAMUSCULAR | Status: AC
  Administered 2021-06-07: 60 mg via INTRAMUSCULAR
  Filled 2021-06-07: qty 2

## 2021-06-07 NOTE — ED Triage Notes (Signed)
Pt comes into the ED via EMS from with c/o waking up with BL LE pain about 34mn PTA, has a hx of fibromyalgia and is out of her medication   138/90 HR88 95%RA

## 2021-06-07 NOTE — ED Provider Notes (Signed)
[facility] Provider Note  Patient Contact: 3:45 PM (approximate)   History   Leg Pain   HPI  Kelsey Hopkins is a 36 y.o. female who presents the emergency department complaining of her fibromyalgia pain.  Patient states that both of her legs are hurting.  She denies any edema, erythema, injuries.  States that it is her typical fibromyalgia pain but states that she cannot pick up her medications.  She states that she was prescribed Voltaren but cannot afford it.  She does not have insurance.  Patient states no erythema, edema, open wounds.     Physical Exam   Triage Vital Signs: ED Triage Vitals [06/07/21 1421]  Enc Vitals Group     BP 121/90     Pulse Rate (!) 104     Resp 18     Temp 98.1 F (36.7 C)     Temp Source Oral     SpO2 96 %     Weight      Height      Head Circumference      Peak Flow      Pain Score      Pain Loc      Pain Edu?      Excl. in [place]?     Most recent vital signs: Vitals:   06/07/21 1421  BP: 121/90  Pulse: (!) 104  Resp: 18  Temp: 98.1 F (36.7 C)  SpO2: 96%     General: Alert and in no acute distress.  Cardiovascular:  Good peripheral perfusion Respiratory: Normal respiratory effort without tachypnea or retractions. Lungs CTAB.  Musculoskeletal: Full range of motion to all extremities.  No visible signs of trauma to either lower extremity with no abrasions, lacerations, ecchymosis.  No deformity.  Good range of motion.  There is no edema, erythema, ecchymosis or warmth to palpation.  Patient reports that the entire bilateral lower extremities hurt with palpation.  It appears though that palpation does not worsen her symptoms that it is just underlying pain. Neurologic:  No gross focal neurologic deficits are appreciated.  Skin:   No rash noted Other:   ED Results / Procedures / Treatments   Labs (all labs ordered are listed, but only abnormal results are displayed) Labs Reviewed - No data to  display   EKG     RADIOLOGY    No results found.  PROCEDURES:  Critical Care performed: No  Procedures   MEDICATIONS ORDERED IN ED: Medications  ketorolac (TORADOL) 30 MG/ML injection 30 mg (has no administration in time range)  orphenadrine (NORFLEX) injection 60 mg (has no administration in time range)     IMPRESSION / MDM / ASSESSMENT AND PLAN / ED COURSE  I reviewed the triage vital signs and the nursing notes.                              Differential diagnosis includes, but is not limited to, fibromyalgia pain, DVT, cellulitis, neuropathy, ischemia  Patient's presentation is most consistent with exacerbation of chronic illness.   Patient's diagnosis is consistent with bilateral lower extremity pain secondary to fibromyalgia.  Patient states that both of her legs hurt at this time.  She states that this is because she cannot afford her medication that she is typically prescribed.  She is supposed to be on Voltaren twice a day.  Patient states that she cannot afford the medications at the pharmacy.  There  is no new injuries, no concerning physical exam findings.  This time no indication for labs or imaging.  I will provide medications for symptom relief here in the emergency department for the patient, will change the prescriptions at home so that the patient hopefully can afford her medications.  Follow-up primary care..  Patient is given ED precautions to return to the ED for any worsening or new symptoms.        FINAL CLINICAL IMPRESSION(S) / ED DIAGNOSES   Final diagnoses:  Fibromyalgia muscle pain     Rx / DC Orders   ED Discharge Orders          Ordered    meloxicam (MOBIC) 15 MG tablet  Daily        06/07/21 1622    methocarbamol (ROBAXIN) 500 MG tablet  4 times daily        06/07/21 1622             Note:  This document was prepared using Dragon voice recognition software and may include unintentional dictation errors.   Brynda Peon 06/07/21 1623    Merlyn Lot, MD 06/07/21 [phone]

## 2021-12-30 DIAGNOSIS — Z7984 Long term (current) use of oral hypoglycemic drugs: Secondary | ICD-10-CM | POA: Diagnosis not present

## 2021-12-30 DIAGNOSIS — E119 Type 2 diabetes mellitus without complications: Secondary | ICD-10-CM | POA: Diagnosis not present

## 2021-12-30 DIAGNOSIS — N61 Mastitis without abscess: Secondary | ICD-10-CM | POA: Diagnosis not present

## 2021-12-30 DIAGNOSIS — Z79899 Other long term (current) drug therapy: Secondary | ICD-10-CM | POA: Diagnosis not present

## 2021-12-30 DIAGNOSIS — R69 Illness, unspecified: Secondary | ICD-10-CM | POA: Diagnosis not present

## 2021-12-30 DIAGNOSIS — M797 Fibromyalgia: Secondary | ICD-10-CM | POA: Diagnosis not present

## 2022-08-19 ENCOUNTER — Other Ambulatory Visit: Payer: Medicaid Other
